# Patient Record
Sex: Male | Born: 1975 | Race: White | Hispanic: No | Marital: Married | Smoking: Former smoker
Health system: Southern US, Community
[De-identification: ages and names within clinical notes are randomized; demographics above are authoritative.]

## PROBLEM LIST (undated history)

## (undated) DIAGNOSIS — I509 Heart failure, unspecified: Secondary | ICD-10-CM

## (undated) DIAGNOSIS — I639 Cerebral infarction, unspecified: Secondary | ICD-10-CM

## (undated) DIAGNOSIS — N210 Calculus in bladder: Secondary | ICD-10-CM

## (undated) DIAGNOSIS — I330 Acute and subacute infective endocarditis: Secondary | ICD-10-CM

## (undated) DIAGNOSIS — N419 Inflammatory disease of prostate, unspecified: Secondary | ICD-10-CM

## (undated) DIAGNOSIS — K648 Other hemorrhoids: Secondary | ICD-10-CM

## (undated) DIAGNOSIS — G459 Transient cerebral ischemic attack, unspecified: Secondary | ICD-10-CM

## (undated) DIAGNOSIS — L719 Rosacea, unspecified: Secondary | ICD-10-CM

## (undated) DIAGNOSIS — I7789 Other specified disorders of arteries and arterioles: Secondary | ICD-10-CM

## (undated) DIAGNOSIS — F419 Anxiety disorder, unspecified: Secondary | ICD-10-CM

## (undated) DIAGNOSIS — R413 Other amnesia: Secondary | ICD-10-CM

## (undated) DIAGNOSIS — E785 Hyperlipidemia, unspecified: Secondary | ICD-10-CM

## (undated) DIAGNOSIS — I6990 Unspecified sequelae of unspecified cerebrovascular disease: Secondary | ICD-10-CM

## (undated) DIAGNOSIS — Z952 Presence of prosthetic heart valve: Secondary | ICD-10-CM

## (undated) DIAGNOSIS — G3184 Mild cognitive impairment, so stated: Secondary | ICD-10-CM

## (undated) HISTORY — DX: Hyperlipidemia, unspecified: E78.5

## (undated) HISTORY — DX: Acute and subacute infective endocarditis: I33.0

## (undated) HISTORY — DX: Calculus in bladder: N21.0

## (undated) HISTORY — PX: WISDOM TOOTH EXTRACTION: SHX21

## (undated) HISTORY — DX: Other amnesia: R41.3

## (undated) HISTORY — DX: Transient cerebral ischemic attack, unspecified: G45.9

## (undated) HISTORY — DX: Other hemorrhoids: K64.8

## (undated) HISTORY — DX: Cerebral infarction, unspecified: I63.9

## (undated) HISTORY — DX: Inflammatory disease of prostate, unspecified: N41.9

## (undated) HISTORY — DX: Presence of prosthetic heart valve: Z95.2

## (undated) HISTORY — DX: Mild cognitive impairment of uncertain or unknown etiology: G31.84

## (undated) HISTORY — DX: Unspecified sequelae of unspecified cerebrovascular disease: I69.90

## (undated) HISTORY — DX: Anxiety disorder, unspecified: F41.9

## (undated) HISTORY — DX: Rosacea, unspecified: L71.9

## (undated) HISTORY — DX: Other specified disorders of arteries and arterioles: I77.89

---

## 1995-01-08 HISTORY — PX: AORTIC VALVE REPLACEMENT: SHX41

## 2001-02-09 ENCOUNTER — Encounter: Payer: Self-pay | Admitting: Cardiology

## 2001-02-09 ENCOUNTER — Ambulatory Visit (HOSPITAL_COMMUNITY): Admission: RE | Admit: 2001-02-09 | Discharge: 2001-02-09 | Payer: Self-pay | Admitting: Cardiology

## 2001-02-10 ENCOUNTER — Ambulatory Visit (HOSPITAL_COMMUNITY): Admission: RE | Admit: 2001-02-10 | Discharge: 2001-02-10 | Payer: Self-pay | Admitting: Cardiology

## 2001-11-13 ENCOUNTER — Encounter: Payer: Self-pay | Admitting: Internal Medicine

## 2001-11-13 ENCOUNTER — Encounter: Admission: RE | Admit: 2001-11-13 | Discharge: 2001-11-13 | Payer: Self-pay | Admitting: Internal Medicine

## 2001-11-27 ENCOUNTER — Encounter: Admission: RE | Admit: 2001-11-27 | Discharge: 2001-11-27 | Payer: Self-pay | Admitting: Internal Medicine

## 2001-11-27 ENCOUNTER — Encounter: Payer: Self-pay | Admitting: Internal Medicine

## 2004-06-29 ENCOUNTER — Ambulatory Visit: Payer: Self-pay | Admitting: Internal Medicine

## 2004-07-02 ENCOUNTER — Ambulatory Visit: Payer: Self-pay | Admitting: Internal Medicine

## 2006-01-07 HISTORY — PX: AORTIC VALVE REPLACEMENT: SHX41

## 2006-10-14 ENCOUNTER — Encounter: Payer: Self-pay | Admitting: Internal Medicine

## 2006-11-25 ENCOUNTER — Encounter: Payer: Self-pay | Admitting: Internal Medicine

## 2006-12-03 ENCOUNTER — Encounter: Payer: Self-pay | Admitting: Internal Medicine

## 2006-12-19 ENCOUNTER — Encounter: Payer: Self-pay | Admitting: Internal Medicine

## 2007-01-08 HISTORY — PX: AORTIC VALVE REPLACEMENT: SHX41

## 2007-02-16 ENCOUNTER — Encounter: Payer: Self-pay | Admitting: Internal Medicine

## 2007-02-23 ENCOUNTER — Encounter: Payer: Self-pay | Admitting: Internal Medicine

## 2007-03-02 ENCOUNTER — Encounter: Payer: Self-pay | Admitting: Internal Medicine

## 2007-04-01 ENCOUNTER — Encounter: Payer: Self-pay | Admitting: Internal Medicine

## 2007-09-02 ENCOUNTER — Encounter: Payer: Self-pay | Admitting: Internal Medicine

## 2008-01-08 DIAGNOSIS — N419 Inflammatory disease of prostate, unspecified: Secondary | ICD-10-CM

## 2008-01-08 HISTORY — PX: OTHER SURGICAL HISTORY: SHX169

## 2008-01-08 HISTORY — DX: Inflammatory disease of prostate, unspecified: N41.9

## 2008-03-09 ENCOUNTER — Encounter: Payer: Self-pay | Admitting: Internal Medicine

## 2008-09-30 ENCOUNTER — Ambulatory Visit: Payer: Self-pay | Admitting: Internal Medicine

## 2008-09-30 DIAGNOSIS — I359 Nonrheumatic aortic valve disorder, unspecified: Secondary | ICD-10-CM | POA: Insufficient documentation

## 2008-09-30 DIAGNOSIS — M79609 Pain in unspecified limb: Secondary | ICD-10-CM | POA: Insufficient documentation

## 2009-03-08 ENCOUNTER — Encounter: Payer: Self-pay | Admitting: Internal Medicine

## 2010-02-08 NOTE — Letter (Signed)
Summary: [facility]  [facility]   Imported By: Lanelle Bal 03/16/2008 12:55:57  _____________________________________________________________________  External Attachment:    Type:   Image     Comment:   External Document

## 2010-02-08 NOTE — Letter (Signed)
Summary: Discharge Summary  Discharge Summary   Imported By: Freddy Jaksch 03/16/2007 10:25:46  _____________________________________________________________________  External Attachment:    Type:   Image     Comment:   External Document

## 2010-02-08 NOTE — Letter (Signed)
Summary: [facility]--discharge letter  [facility]--discharge [facility]   Imported By: Freddy Jaksch 01/06/2007 12:39:51  _____________________________________________________________________  External Attachment:    Type:   Image     Comment:   External Document

## 2010-02-08 NOTE — Letter (Signed)
Summary: [facility]   Imported By: Freddy Jaksch 03/12/2007 09:47:11  _____________________________________________________________________  External Attachment:    Type:   Image     Comment:   External Document

## 2010-02-08 NOTE — Letter (Signed)
Summary: [facility]  [facility]   Imported By: Lanelle Bal 03/24/2009 12:31:26  _____________________________________________________________________  External Attachment:    Type:   Image     Comment:   External Document

## 2010-02-08 NOTE — Letter (Signed)
Summary: [facility]--DISCHARGE SUMMARY  [facility]   Imported By: Freddy Jaksch 03/16/2007 11:10:16  _____________________________________________________________________  External Attachment:    Type:   Image     Comment:   External Document

## 2010-02-08 NOTE — Assessment & Plan Note (Signed)
Summary: ACUTE FOR A BLOOD CLOT IN HISTHIGH//PH   Vital Signs:  Patient profile:   35 year old male Weight:      204 pounds Temp:     97.8 degrees F oral Pulse rate:   76 / minute Resp:     12 per minute BP sitting:   110 / 90  (left arm)  Vitals Entered By: Jeremy Johann CMA (September 30, 2008 12:16 PM) CC: tenderness on inner rt thigh x 1 week   CC:  tenderness on inner rt thigh x 1 week.  History of Present Illness: Onset as tenderness to touch R medial inferior thigh, "as if bruised" 1 week ago. JX:BJYNWGN his hour/ day of jog/walk. No symptoms unless area palpated; no C-P symptoms.  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 CV:  Denies chest pain or discomfort, leg cramps with exertion, palpitations, shortness of breath with exertion, swelling of feet, and swelling of hands.  Physical Exam  General:  well-nourished,in no acute distress; alert,appropriate and cooperative throughout examination Lungs:  Normal respiratory effort, chest expands symmetrically. Lungs are clear to auscultation, no crackles or wheezes. Heart:  Normal rate and regular rhythm. S1  normal without gallop, murmur, click, rub . Soft S4, accentuated S2 Pulses:  R dorsalis pedis and posterior tibial pulses are full and equal bilaterally Extremities:  No clubbing, cyanosis, edema, or deformity noted.Neg Homan's . Slight tenderness R inferior thigh with knee flexed,significantly less so with RLE extended Skin:  ? faint bruising R inf -medial thigh   Impression & Recommendations:  Problem # 1:  THIGH PAIN (ICD-729.5) tendinous injury from jogging  suggested   Problem # 2:  AORTIC VALVE REPLACEMENT, HX OF (ICD-V43.3) X2  Complete Medication List: 1)  Oracea 40 Mg Cpdr  (Doxycycline (rosacea)) .... Take 1 tab once daily 2)  Aspirin 81 Mg Tbec (Aspirin) .... Take 1 tab once daily  Patient Instructions: 1)  Stretching exercises (Ex Cybex) > 3X/week followed by Zostrix cream topically. Glucosamine sulfate 1500 mg once daily until soreness gone.

## 2010-02-08 NOTE — Letter (Signed)
Summary: [facility]//OFFICE VISIT  [facility]//OFFICE VISIT   Imported By: Freddy Jaksch 01/02/2007 10:53:42  _____________________________________________________________________  External Attachment:    Type:   Image     Comment:   External Document

## 2010-02-08 NOTE — Letter (Signed)
Summary: [facility]//CHEMISTRY TRESULT  [facility]Y TRESULT   Imported By: Freddy Jaksch 12/08/2006 10:50:29  _____________________________________________________________________  External Attachment:    Type:   Image     Comment:   External Document

## 2010-02-08 NOTE — Letter (Signed)
Summary: [facility]  [facility]   Imported By: Lanelle Bal 12/29/2007 10:47:14  _____________________________________________________________________  External Attachment:    Type:   Image     Comment:   External Document

## 2010-02-08 NOTE — Letter (Signed)
Summary: [facility]--discharge letter  [facility]--discharge letter   Imported By: Freddy Jaksch 01/13/2007 11:23:23  _____________________________________________________________________  External Attachment:    Type:   Image     Comment:   External Document

## 2010-02-08 NOTE — Letter (Signed)
Summary: [facility]  [facility]   Imported By: Freddy Jaksch 02/25/2007 16:04:07  _____________________________________________________________________  External Attachment:    Type:   Image     Comment:   External Document

## 2010-02-08 NOTE — Letter (Signed)
Summary: Discharge Summary  Discharge Summary   Imported By: Freddy Jaksch 03/04/2007 15:23:59  _____________________________________________________________________  External Attachment:    Type:   Image     Comment:   External Document

## 2010-02-08 NOTE — Letter (Signed)
Summary: [facility]   Imported By: Freddy Jaksch 04/22/2007 10:46:43  _____________________________________________________________________  External Attachment:    Type:   Image     Comment:   External Document

## 2011-12-09 ENCOUNTER — Ambulatory Visit (INDEPENDENT_AMBULATORY_CARE_PROVIDER_SITE_OTHER): Payer: PRIVATE HEALTH INSURANCE | Admitting: Internal Medicine

## 2011-12-09 ENCOUNTER — Encounter: Payer: Self-pay | Admitting: Internal Medicine

## 2011-12-09 VITALS — BP 106/70 | HR 75 | Resp 12 | Ht 75.5 in | Wt 204.0 lb

## 2011-12-09 DIAGNOSIS — N429 Disorder of prostate, unspecified: Secondary | ICD-10-CM

## 2011-12-09 DIAGNOSIS — Z0000 Encounter for general adult medical examination without abnormal findings: Secondary | ICD-10-CM

## 2011-12-09 DIAGNOSIS — E785 Hyperlipidemia, unspecified: Secondary | ICD-10-CM

## 2011-12-09 DIAGNOSIS — Z954 Presence of other heart-valve replacement: Secondary | ICD-10-CM

## 2011-12-09 LAB — CBC WITH DIFFERENTIAL/PLATELET
Basophils Absolute: 0 10*3/uL (ref 0.0–0.1)
Basophils Relative: 0.6 % (ref 0.0–3.0)
Eosinophils Absolute: 0.1 10*3/uL (ref 0.0–0.7)
Eosinophils Relative: 2.3 % (ref 0.0–5.0)
HCT: 45.5 % (ref 39.0–52.0)
Hemoglobin: 15.3 g/dL (ref 13.0–17.0)
Lymphocytes Relative: 32.1 % (ref 12.0–46.0)
Lymphs Abs: 1.5 10*3/uL (ref 0.7–4.0)
MCHC: 33.7 g/dL (ref 30.0–36.0)
MCV: 92.3 fl (ref 78.0–100.0)
Monocytes Absolute: 0.4 10*3/uL (ref 0.1–1.0)
Monocytes Relative: 9.6 % (ref 3.0–12.0)
Neutro Abs: 2.6 10*3/uL (ref 1.4–7.7)
Neutrophils Relative %: 55.4 % (ref 43.0–77.0)
Platelets: 187 10*3/uL (ref 150.0–400.0)
RBC: 4.92 Mil/uL (ref 4.22–5.81)
RDW: 12.8 % (ref 11.5–14.6)
WBC: 4.6 10*3/uL (ref 4.5–10.5)

## 2011-12-09 LAB — LIPID PANEL
Cholesterol: 244 mg/dL — ABNORMAL HIGH (ref 0–200)
HDL: 41 mg/dL (ref >39.00)
Total CHOL/HDL Ratio: 6
Triglycerides: 73 mg/dL (ref 0.0–149.0)
VLDL: 14.6 mg/dL (ref 0.0–40.0)

## 2011-12-09 LAB — HEPATIC FUNCTION PANEL
ALT: 23 U/L (ref 0–53)
AST: 21 U/L (ref 0–37)
Albumin: 4.8 g/dL (ref 3.5–5.2)
Alkaline Phosphatase: 47 U/L (ref 39–117)
Bilirubin, Direct: 0.1 mg/dL (ref 0.0–0.3)
Total Bilirubin: 0.9 mg/dL (ref 0.3–1.2)
Total Protein: 7.2 g/dL (ref 6.0–8.3)

## 2011-12-09 LAB — PSA: PSA: 0.46 ng/mL (ref 0.10–4.00)

## 2011-12-09 LAB — BASIC METABOLIC PANEL
BUN: 17 mg/dL (ref 6–23)
CO2: 27 mEq/L (ref 19–32)
Calcium: 9.2 mg/dL (ref 8.4–10.5)
Chloride: 104 mEq/L (ref 96–112)
Creatinine, Ser: 0.9 mg/dL (ref 0.4–1.5)
GFR: 96.57 mL/min (ref >60.00)
Glucose, Bld: 85 mg/dL (ref 70–99)
Potassium: 4.5 mEq/L (ref 3.5–5.1)
Sodium: 138 mEq/L (ref 135–145)

## 2011-12-09 LAB — TSH: TSH: 0.66 u[IU]/mL (ref 0.35–5.50)

## 2011-12-09 LAB — LDL CHOLESTEROL, DIRECT: Direct LDL: 194.2 mg/dL

## 2011-12-09 NOTE — Patient Instructions (Addendum)
If you activate My Chart; the results can be released to you as soon as they populate from the lab. If you choose not to use this program; the labs have to be reviewed, copied & mailed   causing a delay in getting the results to you. 

## 2011-12-09 NOTE — Progress Notes (Signed)
  Subjective:    Patient ID: Corey Rose, male    DOB: 20-Sep-1975, 36 y.o.   MRN: [id number]  HPI  Corey Rose is here for a physical; he has no acute issues.      Review of Systems He is on a modified heart healthy diet. He exercises for 45 minutes as cardio at least 3 times per week without symptoms. Specifically he denies chest pain, palpitations, dyspnea, or claudication.  Several years ago he was on Zocor for dyslipidemia; this was discontinued due to to muscle pain. Subsequently the lipids have been controlled with lifestyle change.     Objective:   Physical Exam Gen.: Healthy and well-nourished in appearance. Alert, appropriate and cooperative throughout exam. Head: Normocephalic without obvious abnormalities Eyes: No corneal or conjunctival inflammation noted. Pupils equal round reactive to light and accommodation. Fundal exam is benign without hemorrhages, exudate, papilledema. Extraocular motion intact. Vision grossly normal. Ears: External  ear exam reveals no significant lesions or deformities. Canals clear .TMs normal. Hearing is grossly normal bilaterally. Nose: External nasal exam reveals no deformity or inflammation. Nasal mucosa are pink and moist. No lesions or exudates noted.  Mouth: Oral mucosa and oropharynx reveal no lesions or exudates. Teeth in good repair. Neck: No deformities, masses, or tenderness noted. Range of motion & Thyroid normal. Lungs: Normal respiratory effort; chest expands symmetrically. Lungs are clear to auscultation without rales, wheezes, or increased work of breathing. Heart: Normal rate and rhythm. Normal S1 ; accentuated  S2 LSB. No gallop, click, or rub. No murmur. Abdomen: Bowel sounds normal; abdomen soft and nontender. No masses, organomegaly or hernias noted. Genitalia / WUJ:WJXBJYNWG normal except for left varices & granuloma. Left lobe of prostate normal; right lobe upper limits of normal to minimally enlarged w/o nodularity, or  induration. Musculoskeletal/extremities: No deformity or scoliosis noted of  the thoracic or lumbar spine. No clubbing, cyanosis, edema, or deformity noted. Range of motion  normal .Tone & strength  normal.Joints normal. Nail health  good. Vascular: Carotid, radial artery, dorsalis pedis and  posterior tibial pulses are full and equal. No bruits present. Neurologic: Alert and oriented x3. Deep tendon reflexes symmetrical and normal.          Skin: Intact without suspicious lesions or rashes. Lymph: No cervical, axillary, or inguinal lymphadenopathy present. Psych: Mood and affect are normal. Normally interactive                                                                                       Assessment & Plan:  #1 comprehensive physical exam  #2 prostatic asymmetry in the context of past history of urine flow issues and prostatitis  Plan: see Orders

## 2012-01-07 ENCOUNTER — Ambulatory Visit: Payer: PRIVATE HEALTH INSURANCE | Admitting: Cardiology

## 2012-01-20 ENCOUNTER — Ambulatory Visit (INDEPENDENT_AMBULATORY_CARE_PROVIDER_SITE_OTHER): Payer: PRIVATE HEALTH INSURANCE | Admitting: Cardiology

## 2012-01-20 ENCOUNTER — Encounter: Payer: Self-pay | Admitting: Cardiology

## 2012-01-20 VITALS — BP 112/66 | HR 74 | Ht 75.5 in | Wt 204.0 lb

## 2012-01-20 DIAGNOSIS — Z954 Presence of other heart-valve replacement: Secondary | ICD-10-CM

## 2012-01-20 DIAGNOSIS — I359 Nonrheumatic aortic valve disorder, unspecified: Secondary | ICD-10-CM

## 2012-01-20 DIAGNOSIS — E785 Hyperlipidemia, unspecified: Secondary | ICD-10-CM

## 2012-01-20 NOTE — Progress Notes (Signed)
Patient ID: Corey Rose, male   DOB: Apr 24, 1975, 37 y.o.   MRN: [id number] PCP: Dr. Alwyn Ren  37 yo with history of bicuspid valve disorder s/p AVR x 2 presents to establish cardiology followup.  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  He had a homograft aortic valve placed at that time.  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  He had repeat AVR with a Freestyle Medtronic bioprosthetic valve in 2009. Both AVRs were at [facility] (Dr. Nevada Crane).  He was living in [place] until last summer when he moved back to [place].    Patient has been stable in general.  He does a combination of walking/jogging for about 50 minutes several times a week.  No exertional dyspnea.  He is winded climbing steps if he is carrying a bag or trying to walk fast.  This is chronic.  No chest pain.  No tachypalpitations or syncope.  His LDL was very high when checked recently.  He does not have a strong family history of CAD.   ECG: NSR, nonspecific inferior and anterolateral T wave flattening.   Labs (12/13): K 4.5, creatinine 0.9, HDL 41, LDL 194, TSH normal  PMH: 1. Hyperlipidemia: Myalgias with simvastatin. 2. 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 AVRs were both at [facility] Dr. Nevada Crane.  3. H/o prostatitis 4. H/o bladder stones  SH: Married, 1 child, lives in [place].  Works in [employer], does a lot of traveling.   FH: Father with atrial fibrillation, grandfather with "heart disease."   ROS: All systems reviewed and negative except as per HPI.   Current Outpatient Prescriptions  Medication Sig Dispense Refill  . aspirin 81 MG tablet Take 81 mg by mouth daily.      . Tamsulosin HCl (FLOMAX) 0.4 MG CAPS Take 0.4 mg by mouth  daily.       BP 112/66  Pulse 74  Ht 6' 3.5" (1.918 m)  Wt 204 lb (92.534 kg)  BMI 25.16 kg/m2 General: NAD Neck: No JVD, no thyromegaly or thyroid nodule.  Lungs: Clear to auscultation bilaterally with normal respiratory effort. CV: Nondisplaced PMI.  Heart regular S1/S2, no S3/S4, soft 1/6 SEM RUSB.  No peripheral edema.  No carotid bruit.  Normal pedal pulses.  Abdomen: Soft, nontender, no hepatosplenomegaly, no distention.  Skin: Intact without lesions or rashes.  Neurologic: Alert and oriented x 3.  Psych: Normal affect. Extremities: No clubbing or cyanosis.  HEENT: Normal.   Assessment/Plan: 1. Bicuspid aortic valve disorder: Patient had bicuspid aortic valve and developed endocarditis.  He had AVR initially in 1997 then developed a pseudoaneurysm of the aortic root and repeat AVR with a bioprosthetic valve in 2009.  Since that time, he has been doing well.  - I will get an echocardiogram for baseline evaluation of bioprosthetic valve.  - MR angiogram chest to assess for thoracic aortic aneurysm given history of bicuspid aortic valve.  - Needs antibiotic prophylaxis with dental procedures.  2. Hyperlipidemia: LDL is very high.  It would actually be reasonable to go ahead and initiate a statin.  He, however, does not have a strong family history of CAD.  He had myalgias with simvastatin.  He wants to try 6 months of dietary  changes.  I will therefore get a Lipomed profile in 6 months.  If LDL is still high or if he has a high risk profile, will start him on Crestor 5 mg every other day.    Followup in 1 year.   Marca Ancona 01/20/2012 1:52 PM

## 2012-01-20 NOTE — Patient Instructions (Addendum)
Your physician has requested that you have an echocardiogram. Echocardiography is a painless test that uses sound waves to create images of your heart. It provides your doctor with information about the size and shape of your heart and how well your heart's chambers and valves are working. This procedure takes approximately one hour. There are no restrictions for this procedure.  Schedule an appointment for an MRA of your chest with and without contrast.    Your physician recommends that you return for a FASTING lipomed profile in 6 months. (July 2014).   Your physician wants you to follow-up in: 1 year with Dr Shirlee Latch. (January 2015).  You will receive a reminder letter in the mail two months in advance. If you don't receive a letter, please call our office to schedule the follow-up appointment.

## 2012-01-27 ENCOUNTER — Ambulatory Visit (HOSPITAL_COMMUNITY): Payer: PRIVATE HEALTH INSURANCE

## 2012-02-03 ENCOUNTER — Ambulatory Visit (HOSPITAL_COMMUNITY): Payer: PRIVATE HEALTH INSURANCE | Attending: Cardiovascular Disease | Admitting: Radiology

## 2012-02-03 DIAGNOSIS — I359 Nonrheumatic aortic valve disorder, unspecified: Secondary | ICD-10-CM

## 2012-02-03 DIAGNOSIS — I369 Nonrheumatic tricuspid valve disorder, unspecified: Secondary | ICD-10-CM | POA: Insufficient documentation

## 2012-02-03 DIAGNOSIS — Z954 Presence of other heart-valve replacement: Secondary | ICD-10-CM | POA: Insufficient documentation

## 2012-02-03 NOTE — Progress Notes (Signed)
Echocardiogram performed.  

## 2012-02-10 ENCOUNTER — Ambulatory Visit (HOSPITAL_COMMUNITY)
Admission: RE | Admit: 2012-02-10 | Discharge: 2012-02-10 | Disposition: A | Discharge status: Home / Self Care | Payer: PRIVATE HEALTH INSURANCE | Source: Ambulatory Visit | Attending: Cardiology | Admitting: Cardiology

## 2012-02-10 DIAGNOSIS — I359 Nonrheumatic aortic valve disorder, unspecified: Secondary | ICD-10-CM

## 2012-02-10 DIAGNOSIS — Z952 Presence of prosthetic heart valve: Secondary | ICD-10-CM | POA: Insufficient documentation

## 2012-02-10 DIAGNOSIS — I719 Aortic aneurysm of unspecified site, without rupture: Secondary | ICD-10-CM

## 2012-02-10 MED ORDER — GADOBENATE DIMEGLUMINE 529 MG/ML IV SOLN
20.0000 mL | Freq: Once | INTRAVENOUS | Status: AC
  Administered 2012-02-10: 20 mL via INTRAVENOUS

## 2012-06-05 ENCOUNTER — Telehealth: Payer: Self-pay | Admitting: Cardiology

## 2012-06-05 DIAGNOSIS — I330 Acute and subacute infective endocarditis: Secondary | ICD-10-CM

## 2012-06-05 DIAGNOSIS — I359 Nonrheumatic aortic valve disorder, unspecified: Secondary | ICD-10-CM

## 2012-06-05 NOTE — Telephone Encounter (Signed)
Spoke with patient. Pt has a history of endocarditis. He has an infectious disease doctor in [place] that is retiring. He would like to know it Dr Shirlee Latch thinks he should be referred to infectious disease doctor. He states in the past he has called his infectious disease doctor in [facility] when he has night sweats and has gone to get blood cultures. He states he has night sweats x 2 in the last 2 weeks. No other symptoms including fever, no recent dental appts. I will review with Dr Shirlee Latch.

## 2012-06-05 NOTE — Telephone Encounter (Signed)
Reviewed with Dr Shirlee Latch. He recommended referral to Dr Cliffton Asters or Dr Johny Sax in [place]. LMTCB for pt.

## 2012-06-05 NOTE — Telephone Encounter (Signed)
New problem    Need to speak to you regarding an infectious disease doctor

## 2012-06-05 NOTE — Telephone Encounter (Signed)
Spoke with pt. He is aware of Dr Alford Highland recommendation to schedule an appointment with  Dr Cliffton Asters or Dr Johny Sax.

## 2012-06-05 NOTE — Telephone Encounter (Signed)
LMTCB

## 2012-07-13 ENCOUNTER — Other Ambulatory Visit: Payer: PRIVATE HEALTH INSURANCE

## 2012-08-10 ENCOUNTER — Other Ambulatory Visit: Payer: Self-pay | Admitting: Cardiology

## 2012-08-10 ENCOUNTER — Other Ambulatory Visit: Payer: PRIVATE HEALTH INSURANCE

## 2012-08-10 DIAGNOSIS — I359 Nonrheumatic aortic valve disorder, unspecified: Secondary | ICD-10-CM

## 2012-08-11 LAB — NMR LIPOPROFILE WITH LIPIDS
Cholesterol, Total: 243 mg/dL — ABNORMAL HIGH (ref <200)
HDL Particle Number: 25.5 umol/L — ABNORMAL LOW (ref >=30.5)
HDL Size: 8.6 nm — ABNORMAL LOW (ref >=9.2)
HDL-C: 41 mg/dL (ref >=40)
LDL (calc): 185 mg/dL — ABNORMAL HIGH (ref <100)
LDL Particle Number: 1894 nmol/L — ABNORMAL HIGH (ref <1000)
LDL Size: 21.3 nm (ref >20.5)
LP-IR Score: 41 (ref <=45)
Large HDL-P: 1.8 umol/L — ABNORMAL LOW (ref >=4.8)
Large VLDL-P: 1.6 nmol/L (ref <=2.7)
Small LDL Particle Number: <90 nmol/L (ref <=527)
Triglycerides: 83 mg/dL (ref <150)
VLDL Size: 38.6 nm (ref <=46.6)

## 2012-08-14 MED ORDER — ROSUVASTATIN CALCIUM 5 MG PO TABS: ORAL_TABLET | ORAL | Status: DC

## 2012-08-14 NOTE — Addendum Note (Signed)
Addended by: Levi Aland on: 08/14/2012 05:10 PM   Modules accepted: Orders

## 2012-08-14 NOTE — Progress Notes (Signed)
Called patient to inform him that we do not have any Crestor samples; patient verbalized understanding and Rx sent to patient's pharmacy per his request.

## 2012-11-12 ENCOUNTER — Other Ambulatory Visit: Payer: Self-pay

## 2012-12-10 ENCOUNTER — Encounter: Payer: PRIVATE HEALTH INSURANCE | Admitting: Internal Medicine

## 2013-01-21 ENCOUNTER — Telehealth: Payer: Self-pay

## 2013-01-21 DIAGNOSIS — E785 Hyperlipidemia, unspecified: Secondary | ICD-10-CM

## 2013-01-21 DIAGNOSIS — Z0000 Encounter for general adult medical examination without abnormal findings: Secondary | ICD-10-CM

## 2013-01-21 NOTE — Telephone Encounter (Signed)
Medication List and allergies:  Reviewed and updated  90 day supply/mail order: na Local prescriptions: [facility] and [facility]  Immunizations due: declines flu vaccine (has had flu this season)  A/P:   No changes to FH, PSH or Personal Hx Tdap--< 10 years PSA--12/2011--0.46 Orders entered for CMET, CBC, Hep Panel, Lipid Panel, TSH at [facility]  To Discuss with Provider: Not at this time

## 2013-01-25 ENCOUNTER — Ambulatory Visit (INDEPENDENT_AMBULATORY_CARE_PROVIDER_SITE_OTHER): Payer: PRIVATE HEALTH INSURANCE | Admitting: Internal Medicine

## 2013-01-25 ENCOUNTER — Encounter: Payer: Self-pay | Admitting: Internal Medicine

## 2013-01-25 ENCOUNTER — Other Ambulatory Visit (INDEPENDENT_AMBULATORY_CARE_PROVIDER_SITE_OTHER): Payer: PRIVATE HEALTH INSURANCE

## 2013-01-25 VITALS — BP 107/68 | HR 80 | Temp 98.4°F | Ht 75.25 in | Wt 209.6 lb

## 2013-01-25 DIAGNOSIS — Z0000 Encounter for general adult medical examination without abnormal findings: Secondary | ICD-10-CM

## 2013-01-25 DIAGNOSIS — E785 Hyperlipidemia, unspecified: Secondary | ICD-10-CM

## 2013-01-25 LAB — LIPID PANEL
Cholesterol: 148 mg/dL (ref 0–200)
HDL: 43.3 mg/dL (ref >39.00)
LDL Cholesterol: 97 mg/dL (ref 0–99)
Total CHOL/HDL Ratio: 3
Triglycerides: 38 mg/dL (ref 0.0–149.0)
VLDL: 7.6 mg/dL (ref 0.0–40.0)

## 2013-01-25 LAB — CBC WITH DIFFERENTIAL/PLATELET
Basophils Absolute: 0 10*3/uL (ref 0.0–0.1)
Basophils Relative: 0.4 % (ref 0.0–3.0)
Eosinophils Absolute: 0.1 10*3/uL (ref 0.0–0.7)
Eosinophils Relative: 2 % (ref 0.0–5.0)
HCT: 44.2 % (ref 39.0–52.0)
Hemoglobin: 14.9 g/dL (ref 13.0–17.0)
Lymphocytes Relative: 32.5 % (ref 12.0–46.0)
Lymphs Abs: 1.6 10*3/uL (ref 0.7–4.0)
MCHC: 33.6 g/dL (ref 30.0–36.0)
MCV: 91.2 fl (ref 78.0–100.0)
Monocytes Absolute: 0.5 10*3/uL (ref 0.1–1.0)
Monocytes Relative: 10.9 % (ref 3.0–12.0)
Neutro Abs: 2.6 10*3/uL (ref 1.4–7.7)
Neutrophils Relative %: 54.2 % (ref 43.0–77.0)
Platelets: 192 10*3/uL (ref 150.0–400.0)
RBC: 4.85 Mil/uL (ref 4.22–5.81)
RDW: 12.6 % (ref 11.5–14.6)
WBC: 4.9 10*3/uL (ref 4.5–10.5)

## 2013-01-25 LAB — HEPATIC FUNCTION PANEL
ALT: 21 U/L (ref 0–53)
AST: 21 U/L (ref 0–37)
Albumin: 4.3 g/dL (ref 3.5–5.2)
Alkaline Phosphatase: 48 U/L (ref 39–117)
Bilirubin, Direct: 0.1 mg/dL (ref 0.0–0.3)
Total Bilirubin: 0.9 mg/dL (ref 0.3–1.2)
Total Protein: 7.4 g/dL (ref 6.0–8.3)

## 2013-01-25 LAB — BASIC METABOLIC PANEL
BUN: 13 mg/dL (ref 6–23)
CO2: 27 mEq/L (ref 19–32)
Calcium: 9.1 mg/dL (ref 8.4–10.5)
Chloride: 105 mEq/L (ref 96–112)
Creatinine, Ser: 1 mg/dL (ref 0.4–1.5)
GFR: 85.32 mL/min (ref >60.00)
Glucose, Bld: 83 mg/dL (ref 70–99)
Potassium: 4.4 mEq/L (ref 3.5–5.1)
Sodium: 140 mEq/L (ref 135–145)

## 2013-01-25 LAB — TSH: TSH: 0.95 u[IU]/mL (ref 0.35–5.50)

## 2013-01-25 NOTE — Progress Notes (Signed)
   Subjective:    Patient ID: Corey Rose, male    DOB: [birth year], 38 y.o.   MRN: [id number]  HPI He is here for a physical;acute issues include occasional palpitations. She is caffeine to be a trigger. There was a demonstrable decrease when he decreased the amount of caffeine ingestion.     Review of Systems  A heart healthy diet is followed; exercise encompasses 50-60 minutes 4-5  times per week as walking/running without symptoms.  Family history is negative for premature coronary disease. Advanced cholesterol testing reveals  LDL goal is less than 130 ; ideally < 100 . There is medication compliance with the statin.  Low dose ASA taken Specifically denied are  chest pain,  dyspnea, or claudication.  Significant abdominal symptoms, memory deficit, or myalgias not present.      Objective:   Physical Exam Gen.: Healthy and well-nourished in appearance. Alert, appropriate and cooperative throughout exam. Appears younger than stated age  Head: Normocephalic without obvious abnormalities;no alopecia  Eyes: No corneal or conjunctival inflammation noted. Pupils equal round reactive to light and accommodation. Extraocular motion intact. Ears: External  ear exam reveals no significant lesions or deformities. Canals clear .TMs normal.  Nose: External nasal exam reveals no deformity or inflammation. Nasal mucosa are pink and moist. No lesions or exudates noted.   Mouth: Oral mucosa and oropharynx reveal no lesions or exudates. Teeth in good repair. Neck: No deformities, masses, or tenderness noted. Range of motion &Thyroid normal. Lungs: Normal respiratory effort; chest expands symmetrically. Lungs are clear to auscultation without rales, wheezes, or increased work of breathing. Heart: Normal rate and rhythm. Normal S1 ; accentuated S2. No gallop, click, or rub. No murmur. Abdomen: Bowel sounds normal; abdomen soft and nontender. No masses, organomegaly or hernias noted. Genitalia: as per  Urology                                  Musculoskeletal/extremities: No deformity or scoliosis noted of  the thoracic or lumbar spine.  No clubbing, cyanosis, edema, or significant extremity  deformity noted. Range of motion normal .Tone & strength normal. Hand joints normal . Fingernail  health good. Able to lie down & sit up w/o help. Negative SLR bilaterally Vascular: Carotid, radial artery, dorsalis pedis and  posterior tibial pulses are full and equal. No bruits present. Neurologic: Alert and oriented x3. Deep tendon reflexes symmetrical and normal.        Skin: Intact without suspicious lesions or rashes. Lymph: No cervical, axillary lymphadenopathy present. Psych: Mood and affect are normal. Normally interactive                                                                                        Assessment & Plan:  #1 comprehensive physical exam; no acute findings  Plan: see Orders  & Recommendations

## 2013-01-25 NOTE — Patient Instructions (Signed)
Please review Dr Nunzio Cory book Eat, [place] for best  dietary cholesterol information.  Cardiovascular exercise is recommended 30-45 minutes 3-4 times per week.

## 2013-01-25 NOTE — Progress Notes (Signed)
Pre visit review using our clinic review tool, if applicable. No additional management support is needed unless otherwise documented below in the visit note. 

## 2013-08-16 ENCOUNTER — Ambulatory Visit (INDEPENDENT_AMBULATORY_CARE_PROVIDER_SITE_OTHER): Payer: PRIVATE HEALTH INSURANCE | Admitting: Sports Medicine

## 2013-08-16 ENCOUNTER — Encounter: Payer: Self-pay | Admitting: Sports Medicine

## 2013-08-16 VITALS — BP 110/72 | HR 81 | Ht 75.0 in | Wt 200.0 lb

## 2013-08-16 DIAGNOSIS — IMO0002 Reserved for concepts with insufficient information to code with codable children: Secondary | ICD-10-CM

## 2013-08-16 DIAGNOSIS — S76011A Strain of muscle, fascia and tendon of right hip, initial encounter: Secondary | ICD-10-CM

## 2013-08-16 NOTE — Progress Notes (Signed)
   Subjective:    Patient ID: Corey Rose, male    DOB: [birth year], 38 y.o.   MRN: [id number]  HPI Mr. Hulgan is a new patient who presents to clinic with right hip pain. He first noticed this pain about 4 weeks ago when he woke up and started putting his clothes on. He felt a sharp, pulling sensation at his right hip that bothered him most when walking/running. He attributes the pain to an increased frequency of running on pavement the week prior to the start of the pain. He typically does a run/walk regimen of 4-5 miles on gravel or grass. He eased his normal workout regimen, and has been completely pain free for the past 1 week. He has resumed all normal activities, and has begun stretching at the end of his workouts for preventive measure.   Review of Systems     Objective:   Physical Exam Gen: well-dressed, well-nourished man sitting comfortably MSK: Right hip. Full range of motion. No TTP over hip flexors. Positive Thomas test for right hip flexors.        Assessment & Plan:  **Right hip pain: likely muscle strain, now resolved - counseled patient on warming up prior to activity and ending workouts with stretching - reviewed handout on hip flexor stretching exercises - advised patient to return if pain recurs and cannot be resolved with NSAIDs  Written by: Ivan Anchors, MS4

## 2013-12-06 ENCOUNTER — Ambulatory Visit (INDEPENDENT_AMBULATORY_CARE_PROVIDER_SITE_OTHER): Payer: PRIVATE HEALTH INSURANCE | Admitting: *Deleted

## 2013-12-06 ENCOUNTER — Ambulatory Visit (INDEPENDENT_AMBULATORY_CARE_PROVIDER_SITE_OTHER): Payer: PRIVATE HEALTH INSURANCE | Admitting: Internal Medicine

## 2013-12-06 ENCOUNTER — Encounter: Payer: Self-pay | Admitting: Internal Medicine

## 2013-12-06 VITALS — BP 115/73 | HR 74 | Temp 98.1°F | Ht 75.0 in | Wt 218.0 lb

## 2013-12-06 DIAGNOSIS — Z8679 Personal history of other diseases of the circulatory system: Secondary | ICD-10-CM

## 2013-12-06 DIAGNOSIS — Z23 Encounter for immunization: Secondary | ICD-10-CM

## 2013-12-06 NOTE — Progress Notes (Signed)
Patient ID: Corey Rose, male   DOB: 1975/10/02, 38 y.o.   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 Mar Daring  Patient Active Problem List   Diagnosis Date Noted  . Hyperlipidemia 12/09/2011  . AORTIC VALVE REPLACEMENT, HX OF 09/30/2008    Patient's Medications  New Prescriptions   No medications on file  Previous Medications   ASPIRIN 81 MG TABLET    Take 81 mg by mouth daily.   ROSUVASTATIN (CRESTOR) 5 MG TABLET    Take 1 tab (5 mg) every other day or 2.5 mg (1/2 tab) every day.   TAMSULOSIN HCL (FLOMAX) 0.4 MG CAPS    Take 0.4 mg by mouth daily.  Modified Medications   No medications on file  Discontinued Medications   No medications on file    Recommendations: 1. No diagnostic or treatment interventions needed at this time 2. Continue amoxicillin prophylaxis before dental treatments 3. Follow-up here as needed   Assessment: Mr. 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 He is currently without any complications. He knows to take amoxicillin before dental procedures. He knows he can call me if he has any concerns about recurrent endocarditis in the future.   HPI: Corey Rose is a 38 y.o. male with a history of bicuspid aortic valve who developed high fever and right ankle swelling in 1997 at age 16. 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 He underwent emergent homograft aortic valve replacement by Dr. Clementeen Graham at [facility]. 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 A Freestyle Medtronic bioprosthetic aortic valve replacement was done in 2009 by Dr. Clementeen Graham. He states he has occasional palpitations but otherwise he's had no cardiac complications since  that time. He underwent a cardiac MRI in February 2014 which showed the valve and aortic root to be normal in appearance. He routinely takes amoxicillin 2 g one hour before dental procedures. He recalls having to be hospitalized with a bout of prostatitis while living in [place], [place] and 2010. He was treated with ciprofloxacin and recovered uneventfully.   He states that he worries whenever he develops any illness with fever. He used to see Dr. Marily Lente at [facility] every 1-2 years. Dr. Leontine Locket is now retired and he states that he would feel more comfortable if he had an established relationship with an infectious disease doctor hearing [place].  Review of Systems: Constitutional: negative Eyes: negative Ears, nose, mouth, throat, and face: negative Respiratory: negative Cardiovascular: positive for palpitations Gastrointestinal: negative Genitourinary:negative    Past Medical History  Diagnosis Date  . History of aortic valve replacement 1997, 2009   . Prostatitis 2010     Dr Merryl Hacker , [facility]  . Rosacea   . Hyperlipidemia     History  Substance Use Topics  . Smoking status: Former Smoker    Start date: 01/07/1994    Quit date: 01/07/1994  . Smokeless tobacco: Never Used     Comment: smoked 1990-1996, up to 1 pp week  . Alcohol Use: No     Comment:  quit 2009    Family History  Problem Relation Age of Onset  . Hyperlipidemia Mother   . Skin cancer  Mother     ? melanoma, basal & squamous cell  . Hyperlipidemia Father   . Atrial fibrillation Father   . Skin cancer Father   . Stroke Neg Hx   . Diabetes Neg Hx   . Heart attack Neg Hx    Allergies  Allergen Reactions  . Zocor [Simvastatin]     Muscle pain    OBJECTIVE: Blood pressure 115/73, pulse 74, temperature 98.1 F (36.7 C), temperature source Oral, height 6\' 3"  (1.905 m), weight 218 lb (98.884 kg). General: He is a healthy-appearing young man. He is in  good spirits Skin: No rash Lungs: Clear Cor: Regular S1 and S2 with a 1/6 early systolic murmur Abdomen: Soft and nontender Joints and extremities: Normal  Microbiology: No results found for this or any previous visit (from the past 240 hour(s)).  Michel Bickers, MD [facility] for Infectious [facility] [phone] pager   [phone] cell 12/06/2013, 2:22 PM

## 2014-02-17 ENCOUNTER — Other Ambulatory Visit: Payer: Self-pay | Admitting: Cardiology

## 2014-02-21 ENCOUNTER — Telehealth: Payer: Self-pay

## 2014-02-21 ENCOUNTER — Other Ambulatory Visit: Payer: Self-pay | Admitting: Nurse Practitioner

## 2014-02-21 DIAGNOSIS — I359 Nonrheumatic aortic valve disorder, unspecified: Secondary | ICD-10-CM

## 2014-02-21 DIAGNOSIS — E785 Hyperlipidemia, unspecified: Secondary | ICD-10-CM

## 2014-02-21 NOTE — Telephone Encounter (Signed)
May refill Crestor but needs lipids/LFTs checked.

## 2014-02-22 ENCOUNTER — Other Ambulatory Visit: Payer: Self-pay

## 2014-02-22 MED ORDER — ASPIRIN 81 MG PO TABS: 81.0000 mg | ORAL_TABLET | Freq: Every day | ORAL | Status: DC

## 2014-02-24 NOTE — Telephone Encounter (Signed)
I have placed an order for fasting lipid /liver profile in Epic.  I have sent a message to Dr Claris Gladden scheduler to schedule fasting lab and an appt for pt with Dr Aundra Dubin.

## 2014-03-01 NOTE — Telephone Encounter (Signed)
2.23.16 lvm for pt to call back to sched these appt.dwm

## 2014-03-11 ENCOUNTER — Other Ambulatory Visit: Payer: PRIVATE HEALTH INSURANCE | Admitting: *Deleted

## 2014-03-21 ENCOUNTER — Other Ambulatory Visit (INDEPENDENT_AMBULATORY_CARE_PROVIDER_SITE_OTHER): Payer: PRIVATE HEALTH INSURANCE | Admitting: *Deleted

## 2014-03-21 DIAGNOSIS — I359 Nonrheumatic aortic valve disorder, unspecified: Secondary | ICD-10-CM

## 2014-03-21 DIAGNOSIS — E785 Hyperlipidemia, unspecified: Secondary | ICD-10-CM

## 2014-03-21 LAB — HEPATIC FUNCTION PANEL
ALT: 18 U/L (ref 0–53)
AST: 20 U/L (ref 0–37)
Albumin: 4.2 g/dL (ref 3.5–5.2)
Alkaline Phosphatase: 57 U/L (ref 39–117)
Bilirubin, Direct: 0.1 mg/dL (ref 0.0–0.3)
Total Bilirubin: 0.5 mg/dL (ref 0.2–1.2)
Total Protein: 6.6 g/dL (ref 6.0–8.3)

## 2014-03-21 LAB — LIPID PANEL
Cholesterol: 169 mg/dL (ref 0–200)
HDL: 44.2 mg/dL (ref >39.00)
LDL Cholesterol: 111 mg/dL — ABNORMAL HIGH (ref 0–99)
NonHDL: 124.8
Total CHOL/HDL Ratio: 4
Triglycerides: 67 mg/dL (ref 0.0–149.0)
VLDL: 13.4 mg/dL (ref 0.0–40.0)

## 2014-03-21 NOTE — Addendum Note (Signed)
Addended by: Eulis Foster on: 03/21/2014 08:13 AM   Modules accepted: Orders

## 2014-03-27 ENCOUNTER — Other Ambulatory Visit: Payer: Self-pay | Admitting: Cardiology

## 2014-04-06 ENCOUNTER — Other Ambulatory Visit: Payer: Self-pay

## 2014-04-26 ENCOUNTER — Other Ambulatory Visit: Payer: Self-pay | Admitting: Cardiology

## 2014-06-03 ENCOUNTER — Ambulatory Visit: Payer: PRIVATE HEALTH INSURANCE | Admitting: Cardiology

## 2014-06-13 ENCOUNTER — Other Ambulatory Visit: Payer: Self-pay | Admitting: *Deleted

## 2014-06-13 ENCOUNTER — Other Ambulatory Visit: Payer: Self-pay | Admitting: Cardiology

## 2014-06-13 MED ORDER — ROSUVASTATIN CALCIUM 5 MG PO TABS: ORAL_TABLET | ORAL | Status: DC

## 2014-09-16 ENCOUNTER — Ambulatory Visit (INDEPENDENT_AMBULATORY_CARE_PROVIDER_SITE_OTHER): Payer: PRIVATE HEALTH INSURANCE | Admitting: Cardiology

## 2014-09-16 ENCOUNTER — Encounter: Payer: Self-pay | Admitting: Cardiology

## 2014-09-16 VITALS — BP 112/76 | HR 82 | Ht 75.0 in | Wt 214.4 lb

## 2014-09-16 DIAGNOSIS — I359 Nonrheumatic aortic valve disorder, unspecified: Secondary | ICD-10-CM

## 2014-09-16 DIAGNOSIS — I712 Thoracic aortic aneurysm, without rupture: Secondary | ICD-10-CM

## 2014-09-16 DIAGNOSIS — E785 Hyperlipidemia, unspecified: Secondary | ICD-10-CM | POA: Diagnosis not present

## 2014-09-16 DIAGNOSIS — I77810 Thoracic aortic ectasia: Secondary | ICD-10-CM

## 2014-09-16 MED ORDER — ROSUVASTATIN CALCIUM 5 MG PO TABS: ORAL_TABLET | ORAL | Status: DC

## 2014-09-16 NOTE — Patient Instructions (Addendum)
Medication Instructions:  No changes today  Labwork: None today  Testing/Procedures: Schedule an appointment for an MRA of your chest in February in 2017.  Follow-Up: Your physician wants you to follow-up in: 1 year with Dr Aundra Dubin. (September 2017) You will receive a reminder letter in the mail two months in advance. If you don't receive a letter, please call our office to schedule the follow-up appointment.  Marland Kitchen

## 2014-09-18 NOTE — Progress Notes (Signed)
Patient ID: Corey Rose, male   DOB: 07-14-1975, 39 y.o.   MRN: [id number] PCP: Dr. Linna Darner  39 yo with history of bicuspid valve disorder s/p AVR x 2 presents to establish cardiology followup.  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  He had a homograft aortic valve placed at that time.  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  He had repeat AVR with a Freestyle Medtronic bioprosthetic valve in 2009. Both AVRs were at [facility] (Dr. Clementeen Graham).    Patient has been stable in general.  He has been exercising fairly heavily with on problems.  No exertional dyspnea or chest pain.  No lightheadedness.  No tachypalpitations or syncope.  Most recent echo from [facility] was stable. He has occasional palpitations, especially with caffeine use.   ECG: NSR, normal  Labs (12/13): K 4.5, creatinine 0.9, HDL 41, LDL 194, TSH normal Labs (3/16): LDL 111, HDL 44  PMH: 1. Hyperlipidemia: Myalgias with simvastatin. 2. 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 AVRs were both at [facility], Dr. Clementeen Graham.  Echo (1/14) with EF 55-60%, bioprosthetic aortic valve with mean gradient 5 mmHg.  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  Echo (3/16) with EF 55-60%, bioprosthetic aortic valve with mean gradient 4 mmHg and trivial AI, normal RV size and systolic function,  3. H/o prostatitis 4. H/o bladder stones  SH: Married, 1 child, lives in [place].  Works in Scientist, clinical (histocompatibility and immunogenetics), does a lot of traveling.   FH: Father with atrial fibrillation, grandfather with "heart disease."   ROS: All systems reviewed and negative except as per HPI.   Current Outpatient Prescriptions  Medication Sig Dispense Refill  . aspirin 81 MG tablet Take 1 tablet (81 mg  total) by mouth daily. 30 tablet 3  . rosuvastatin (CRESTOR) 5 MG tablet TAKE 1 TABLET BY MOUTH EVERY OTHER DAY OR 1/2 TABLET BY MOUTH DAILY AS DIRECTED 30 tablet 12  . Tamsulosin HCl (FLOMAX) 0.4 MG CAPS Take 0.4 mg by mouth daily.     No current facility-administered medications for this visit.   BP 112/76 mmHg  Pulse 82  Ht 6\' 3"  (1.905 m)  Wt 214 lb 6.4 oz (97.251 kg)  BMI 26.80 kg/m2 General: NAD Neck: No JVD, no thyromegaly or thyroid nodule.  Lungs: Clear to auscultation bilaterally with normal respiratory effort. CV: Nondisplaced PMI.  Heart regular S1/S2, no S3/S4, soft 1/6 SEM RUSB.  No peripheral edema.  No carotid bruit.  Normal pedal pulses.  Abdomen: Soft, nontender, no hepatosplenomegaly, no distention.  Skin: Intact without lesions or rashes.  Neurologic: Alert and oriented x 3.  Psych: Normal affect. Extremities: No clubbing or cyanosis.  HEENT: Normal.   Assessment/Plan: 1. Bicuspid aortic valve disorder: Patient had bicuspid aortic valve and developed endocarditis.  He had AVR initially in 1997 then developed a pseudoaneurysm of the aortic root and repeat AVR with a bioprosthetic valve in 2009.  Since that time, he has been doing well.  Last echo in 3/16 showed a well-seated prosthetic valve.  He had mild ascending aorta dilation on MRA chest in 2/14.  - I will arrange for an MRA chest again in 2/17 to follow the ascending aorta size.  - Needs antibiotic prophylaxis  with dental procedures.  2. Hyperlipidemia: LDL better on Crestor.  Continue.    3. Palpitations: Suspect occasional PACs or PVCs.   Followup in 1 year.   Loralie Champagne 09/18/2014 10:22 PM

## 2015-01-08 HISTORY — PX: VASECTOMY: SHX75

## 2015-02-07 ENCOUNTER — Ambulatory Visit (HOSPITAL_COMMUNITY)
Admission: RE | Admit: 2015-02-07 | Discharge: 2015-02-07 | Disposition: A | Discharge status: Home / Self Care | Payer: PRIVATE HEALTH INSURANCE | Source: Ambulatory Visit | Attending: Cardiology | Admitting: Cardiology

## 2015-02-07 DIAGNOSIS — Z953 Presence of xenogenic heart valve: Secondary | ICD-10-CM | POA: Insufficient documentation

## 2015-02-07 DIAGNOSIS — I77810 Thoracic aortic ectasia: Secondary | ICD-10-CM | POA: Diagnosis present

## 2015-02-07 DIAGNOSIS — I359 Nonrheumatic aortic valve disorder, unspecified: Secondary | ICD-10-CM

## 2015-02-07 MED ORDER — GADOBENATE DIMEGLUMINE 529 MG/ML IV SOLN
20.0000 mL | Freq: Once | INTRAVENOUS | Status: AC | PRN
  Administered 2015-02-07: 20 mL via INTRAVENOUS

## 2015-02-10 DIAGNOSIS — I359 Nonrheumatic aortic valve disorder, unspecified: Secondary | ICD-10-CM | POA: Diagnosis not present

## 2015-10-01 ENCOUNTER — Other Ambulatory Visit: Payer: Self-pay | Admitting: Cardiology

## 2015-10-01 DIAGNOSIS — I359 Nonrheumatic aortic valve disorder, unspecified: Secondary | ICD-10-CM

## 2015-10-01 DIAGNOSIS — I77810 Thoracic aortic ectasia: Secondary | ICD-10-CM

## 2016-02-05 ENCOUNTER — Other Ambulatory Visit: Payer: Self-pay | Admitting: Cardiology

## 2016-02-05 DIAGNOSIS — I77810 Thoracic aortic ectasia: Secondary | ICD-10-CM

## 2016-02-05 DIAGNOSIS — I359 Nonrheumatic aortic valve disorder, unspecified: Secondary | ICD-10-CM

## 2017-01-31 ENCOUNTER — Telehealth (HOSPITAL_COMMUNITY): Payer: Self-pay | Admitting: Cardiology

## 2017-01-31 NOTE — Telephone Encounter (Signed)
Called patient to schedule

## 2017-02-04 ENCOUNTER — Encounter (HOSPITAL_COMMUNITY): Payer: Self-pay | Admitting: Cardiology

## 2017-02-04 NOTE — Telephone Encounter (Signed)
Called patient to schedule yearly f/u with Dr. Aundra Dubin per staff message from Shirley Muscat, RN.  No VM on patient's phone.  Will try to call patient back at later time.

## 2017-02-14 ENCOUNTER — Telehealth (HOSPITAL_COMMUNITY): Payer: Self-pay | Admitting: Vascular Surgery

## 2017-02-14 NOTE — Telephone Encounter (Signed)
Returned pt call to make f/u appt w/ [facility] in March

## 2017-04-04 ENCOUNTER — Ambulatory Visit (HOSPITAL_COMMUNITY)
Admission: RE | Admit: 2017-04-04 | Discharge: 2017-04-04 | Disposition: A | Discharge status: Home / Self Care | Payer: PRIVATE HEALTH INSURANCE | Source: Ambulatory Visit | Attending: Cardiology | Admitting: Cardiology

## 2017-04-04 ENCOUNTER — Telehealth (HOSPITAL_COMMUNITY): Payer: Self-pay | Admitting: *Deleted

## 2017-04-04 ENCOUNTER — Encounter (HOSPITAL_COMMUNITY): Payer: Self-pay | Admitting: Cardiology

## 2017-04-04 VITALS — BP 117/80 | HR 73 | Wt 219.8 lb

## 2017-04-04 DIAGNOSIS — Z7982 Long term (current) use of aspirin: Secondary | ICD-10-CM | POA: Diagnosis not present

## 2017-04-04 DIAGNOSIS — E785 Hyperlipidemia, unspecified: Secondary | ICD-10-CM | POA: Diagnosis not present

## 2017-04-04 DIAGNOSIS — Z79899 Other long term (current) drug therapy: Secondary | ICD-10-CM | POA: Insufficient documentation

## 2017-04-04 DIAGNOSIS — Z09 Encounter for follow-up examination after completed treatment for conditions other than malignant neoplasm: Secondary | ICD-10-CM | POA: Diagnosis not present

## 2017-04-04 DIAGNOSIS — Z87442 Personal history of urinary calculi: Secondary | ICD-10-CM | POA: Diagnosis not present

## 2017-04-04 DIAGNOSIS — Z953 Presence of xenogenic heart valve: Secondary | ICD-10-CM

## 2017-04-04 DIAGNOSIS — Q231 Congenital insufficiency of aortic valve: Secondary | ICD-10-CM | POA: Diagnosis not present

## 2017-04-04 DIAGNOSIS — I77810 Thoracic aortic ectasia: Secondary | ICD-10-CM | POA: Diagnosis not present

## 2017-04-04 DIAGNOSIS — I712 Thoracic aortic aneurysm, without rupture, unspecified: Secondary | ICD-10-CM

## 2017-04-04 DIAGNOSIS — Z8249 Family history of ischemic heart disease and other diseases of the circulatory system: Secondary | ICD-10-CM | POA: Insufficient documentation

## 2017-04-04 LAB — CBC
HCT: 44.4 % (ref 39.0–52.0)
Hemoglobin: 15.5 g/dL (ref 13.0–17.0)
MCH: 31.1 pg (ref 26.0–34.0)
MCHC: 34.9 g/dL (ref 30.0–36.0)
MCV: 89.2 fL (ref 78.0–100.0)
Platelets: 202 10*3/uL (ref 150–400)
RBC: 4.98 MIL/uL (ref 4.22–5.81)
RDW: 12.2 % (ref 11.5–15.5)
WBC: 7.2 10*3/uL (ref 4.0–10.5)

## 2017-04-04 LAB — BASIC METABOLIC PANEL
Anion gap: 8 (ref 5–15)
BUN: 15 mg/dL (ref 6–20)
CO2: 24 mmol/L (ref 22–32)
Calcium: 9.6 mg/dL (ref 8.9–10.3)
Chloride: 105 mmol/L (ref 101–111)
Creatinine, Ser: 1.12 mg/dL (ref 0.61–1.24)
GFR calc Af Amer: >60 mL/min (ref >60)
GFR calc non Af Amer: >60 mL/min (ref >60)
Glucose, Bld: 86 mg/dL (ref 65–99)
Potassium: 4.3 mmol/L (ref 3.5–5.1)
Sodium: 137 mmol/L (ref 135–145)

## 2017-04-04 LAB — LIPID PANEL
Cholesterol: 165 mg/dL (ref 0–200)
HDL: 46 mg/dL (ref >40)
LDL Cholesterol: 104 mg/dL — ABNORMAL HIGH (ref 0–99)
Total CHOL/HDL Ratio: 3.6 RATIO
Triglycerides: 73 mg/dL (ref <150)
VLDL: 15 mg/dL (ref 0–40)

## 2017-04-04 NOTE — Patient Instructions (Signed)
Your physician has requested that you have a cardiac MRI. Cardiac MRI uses a computer to create images of your heart as its beating, producing both still and moving pictures of your heart and major blood vessels. For further information please visit [email or web address] Please follow the instruction sheet given to you today for more information.  Labs drawn today (if we do not call you, then your lab work was stable)   Your physician recommends that you schedule a follow-up appointment in: 1 year March 2020 Please Call an Schedule Appointment

## 2017-04-04 NOTE — Telephone Encounter (Signed)
Result Notes for Basic Metabolic Panel (BMET)   Notes recorded by Darron Doom, RN on 04/04/2017 at 2:55 PM EDT Patient called back and he is aware of results. ------  Notes recorded by Shirley Muscat, RN on 04/04/2017 at 2:15 PM EDT Left VM  ------  Notes recorded by Larey Dresser, MD on 04/04/2017 at 12:59 PM EDT LDL 104, this is better than prior. Please call him with results.

## 2017-04-06 NOTE — Progress Notes (Signed)
Patient ID: Corey Rose, male   DOB: 1975-03-23, 42 y.o.   MRN: [id number] PCP: Dr. Joylene Draft Cardiology: Dr. Aundra Dubin  42 yo with history of bicuspid valve disorder s/p AVR x 2 presents for followup of aortic valve disorder.  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  He had a homograft aortic valve placed at that time.  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  He had repeat AVR with a Freestyle Medtronic bioprosthetic valve in 2009. Both AVRs were at [facility] (Dr. Clementeen Graham).    Patient is generally doing well.  He had his last echo done in 4/18 at [facility], bioprosthetic aortic valved was well-seated with mild aortic insufficiency.  He exercises regularly and just ran the [facility] [place] in [place] without any problems.  No exertional dyspnea, chest pain, lightheadedness, syncope, or palpitations.  Weight is fairly stable.   ECG (personally reviewed): NSR, normal  Labs (12/13): K 4.5, creatinine 0.9, HDL 41, LDL 194, TSH normal Labs (3/16): LDL 111, HDL 44  PMH: 1. Hyperlipidemia: Myalgias with simvastatin. 2. 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 AVRs were both at [facility], Dr. Clementeen Graham.  Echo (1/14) with EF 55-60%, bioprosthetic aortic valve with mean gradient 5 mmHg.  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  Echo (3/16) with EF 55-60%, bioprosthetic aortic valve with mean gradient 4 mmHg and trivial AI, normal RV size and systolic function.  - Echo (4/18): EF 60-65%, well-seated bioprosthetic aortic valve with no stenosis, mild regurgitation.  - MRA chest (1/17) with 4.3 cm ascending aorta.  3. H/o prostatitis 4. H/o bladder stones  SH: Married, 2 children, lives in [place].  [name].    FH: Father with atrial fibrillation, grandfather with "heart disease."   ROS: All systems reviewed and negative except as per HPI.   Current Outpatient Medications  Medication Sig Dispense Refill  . aspirin 81 MG tablet Take 1 tablet (81 mg total) by mouth daily. 30 tablet 3  . escitalopram (LEXAPRO) 10 MG tablet Take 10 mg by mouth daily.    . rosuvastatin (CRESTOR) 5 MG tablet Take 5 mg by mouth daily.    . Tamsulosin HCl (FLOMAX) 0.4 MG CAPS Take 0.4 mg by mouth daily.     No current facility-administered medications for this encounter.    BP 117/80   Pulse 73   Wt 219 lb 12 oz (99.7 kg)   SpO2 98%   BMI 27.47 kg/m  General: NAD Neck: No JVD, no thyromegaly or thyroid nodule.  Lungs: Clear to auscultation bilaterally with normal respiratory effort. CV: Nondisplaced PMI.  Heart regular S1/S2, no S3/S4, 2/6 early diastolic murmur upper sternal border.  No peripheral edema.  No carotid bruit.  Normal pedal pulses.  Abdomen: Soft, nontender, no hepatosplenomegaly, no distention.  Skin: Intact without lesions or rashes.  Neurologic: Alert and oriented x 3.  Psych: Normal affect. Extremities: No clubbing or cyanosis.  HEENT: Normal.   Assessment/Plan: 1. Bicuspid aortic valve disorder: Patient had bicuspid aortic valve and developed endocarditis.  He had AVR initially in 1997 then developed a pseudoaneurysm of the aortic root and repeat AVR with a bioprosthetic valve in 2009.  Since that time, he has been doing well symptomatically.  Last echo in 4/18 showed a well-seated prosthetic valve with mild aortic insufficiency.  He had mild ascending aorta dilation on MRA chest in 2/14.  - I will arrange for repeat MRA chest to follow the ascending aorta size.  - Diastolic murmur seems more prominent to me.  He is scheduled for an echo at [facility] in 4/19, I will await this echo.  Need to pay close attention to peri-valvular aortic insufficiency.  - Needs antibiotic prophylaxis with  dental procedures.  2. Hyperlipidemia: Check lipids today, he is on Crestor.      Followup in 1 year if aortic valve is stable on 4/19 echo.    Loralie Champagne 04/06/2017 1:51 PM

## 2017-04-30 ENCOUNTER — Ambulatory Visit (HOSPITAL_COMMUNITY): Payer: PRIVATE HEALTH INSURANCE

## 2017-06-11 HISTORY — PX: ANOMALOUS PULMONARY VENOUS RETURN REPAIR, TOTAL: SHX1156

## 2017-07-17 ENCOUNTER — Telehealth (HOSPITAL_COMMUNITY): Payer: Self-pay | Admitting: *Deleted

## 2017-07-17 NOTE — Telephone Encounter (Signed)
Patient has been in [facility] center in [place] and [place] a Tourist information centre manager called asking to setup a follow up appointment with Dr. Aundra Dubin.  Patient will be discharged around July 21-22nd.    I scheduled patient for Dr. Claris Gladden next available on Sept 3 @ 11:40 and Claiborne Billings was happy with that.  She will fax records over to our office once discharged.  No further questions.

## 2017-08-04 ENCOUNTER — Telehealth (HOSPITAL_COMMUNITY): Payer: Self-pay | Admitting: *Deleted

## 2017-08-04 DIAGNOSIS — I712 Thoracic aortic aneurysm, without rupture, unspecified: Secondary | ICD-10-CM

## 2017-08-04 DIAGNOSIS — R188 Other ascites: Secondary | ICD-10-CM

## 2017-08-04 NOTE — Telephone Encounter (Signed)
Dr. Aundra Dubin has ordered for patient to have abdominal US for ascites and bmet.  Orders placed and sent to scheduling to contact patient for appointments.

## 2017-08-06 ENCOUNTER — Other Ambulatory Visit (HOSPITAL_COMMUNITY): Payer: Self-pay | Admitting: *Deleted

## 2017-08-06 DIAGNOSIS — I712 Thoracic aortic aneurysm, without rupture, unspecified: Secondary | ICD-10-CM

## 2017-08-06 NOTE — Addendum Note (Signed)
Addended by: Darron Doom on: 08/06/2017 02:11 PM   Modules accepted: Orders

## 2017-08-06 NOTE — Telephone Encounter (Signed)
Patient has been scheduled for TEE on Monday August 5th @ 1:00PM.  He is aware to go to Admissions at 11:30 after his abdominal US.  No further questions.

## 2017-08-06 NOTE — Telephone Encounter (Signed)
I reviewed Mr Corey Rose's echo from [facility] in [place].  Images are somewhat difficult but he has moderate to severe aortic insufficiency, appears peri-valvular around his TAVR valve.  EF 55-60%, RV function preserved.   I discussed results over the phone with the patient and his wife.  He is going to need valve surgery again it appears at some point in the future.  I am going to have him get a TEE to better evaluate the valve as the anatomy in the aortic root is complex.    Loralie Champagne 08/06/2017 10:14 AM

## 2017-08-08 ENCOUNTER — Ambulatory Visit (HOSPITAL_COMMUNITY): Payer: PRIVATE HEALTH INSURANCE

## 2017-08-08 ENCOUNTER — Telehealth (HOSPITAL_COMMUNITY): Payer: Self-pay | Admitting: *Deleted

## 2017-08-08 NOTE — Telephone Encounter (Signed)
TEE no pre cert reqd call ref #[id number]

## 2017-08-10 NOTE — Anesthesia Preprocedure Evaluation (Addendum)
Anesthesia Evaluation  Patient identified by MRN, date of birth, ID band Patient awake    Reviewed: Allergy & Precautions, NPO status , Patient's Chart, lab work & pertinent test results  Airway Mallampati: II  TM Distance: >3 FB Neck ROM: Full    Dental no notable dental hx. (+) Teeth Intact, Dental Advisory Given   Pulmonary neg pulmonary ROS, former smoker,    Pulmonary exam normal breath sounds clear to auscultation       Cardiovascular Exercise Tolerance: Good METS: 7 - 9 Mets negative cardio ROS Normal cardiovascular exam+ Valvular Problems/Murmurs AI  Rhythm:Regular Rate:Normal + Diastolic murmurs    Neuro/Psych negative neurological ROS  negative psych ROS   GI/Hepatic negative GI ROS, Neg liver ROS,   Endo/Other  negative endocrine ROS  Renal/GU negative Renal ROS     Musculoskeletal negative musculoskeletal ROS (+)   Abdominal   Peds  Hematology negative hematology ROS (+)   Anesthesia Other Findings   Reproductive/Obstetrics                             Anesthesia Physical Anesthesia Plan  ASA: II  Anesthesia Plan: MAC   Post-op Pain Management:    Induction: Intravenous  PONV Risk Score and Plan:   Airway Management Planned: Mask, Natural Airway and Nasal Cannula  Additional Equipment:   Intra-op Plan:   Post-operative Plan:   Informed Consent: I have reviewed the patients History and Physical, chart, labs and discussed the procedure including the risks, benefits and alternatives for the proposed anesthesia with the patient or authorized representative who has indicated his/her understanding and acceptance.     Plan Discussed with: CRNA  Anesthesia Plan Comments:         Anesthesia Quick Evaluation

## 2017-08-11 ENCOUNTER — Ambulatory Visit (HOSPITAL_COMMUNITY): Payer: PRIVATE HEALTH INSURANCE | Admitting: Anesthesiology

## 2017-08-11 ENCOUNTER — Ambulatory Visit (HOSPITAL_COMMUNITY)
Admission: RE | Admit: 2017-08-11 | Discharge: 2017-08-11 | Disposition: A | Discharge status: Home / Self Care | Payer: PRIVATE HEALTH INSURANCE | Source: Ambulatory Visit | Attending: Cardiology | Admitting: Cardiology

## 2017-08-11 ENCOUNTER — Encounter (HOSPITAL_COMMUNITY)
Admission: RE | Disposition: A | Discharge status: Home / Self Care | Payer: Self-pay | Source: Ambulatory Visit | Attending: Cardiology

## 2017-08-11 ENCOUNTER — Encounter (HOSPITAL_COMMUNITY): Payer: Self-pay

## 2017-08-11 ENCOUNTER — Other Ambulatory Visit (HOSPITAL_COMMUNITY): Payer: Self-pay | Admitting: *Deleted

## 2017-08-11 ENCOUNTER — Other Ambulatory Visit: Payer: Self-pay

## 2017-08-11 ENCOUNTER — Ambulatory Visit (HOSPITAL_COMMUNITY)
Admission: RE | Admit: 2017-08-11 | Discharge: 2017-08-11 | Disposition: A | Discharge status: Home / Self Care | Payer: PRIVATE HEALTH INSURANCE | Source: Ambulatory Visit | Attending: Internal Medicine | Admitting: Internal Medicine

## 2017-08-11 ENCOUNTER — Ambulatory Visit (HOSPITAL_COMMUNITY): Payer: PRIVATE HEALTH INSURANCE

## 2017-08-11 ENCOUNTER — Other Ambulatory Visit (HOSPITAL_COMMUNITY): Payer: Self-pay | Admitting: Cardiology

## 2017-08-11 DIAGNOSIS — R0600 Dyspnea, unspecified: Secondary | ICD-10-CM | POA: Insufficient documentation

## 2017-08-11 DIAGNOSIS — Z8249 Family history of ischemic heart disease and other diseases of the circulatory system: Secondary | ICD-10-CM | POA: Diagnosis not present

## 2017-08-11 DIAGNOSIS — Z7901 Long term (current) use of anticoagulants: Secondary | ICD-10-CM | POA: Insufficient documentation

## 2017-08-11 DIAGNOSIS — I061 Rheumatic aortic insufficiency: Secondary | ICD-10-CM | POA: Diagnosis not present

## 2017-08-11 DIAGNOSIS — Z888 Allergy status to other drugs, medicaments and biological substances status: Secondary | ICD-10-CM | POA: Diagnosis not present

## 2017-08-11 DIAGNOSIS — Z87891 Personal history of nicotine dependence: Secondary | ICD-10-CM | POA: Insufficient documentation

## 2017-08-11 DIAGNOSIS — Z7982 Long term (current) use of aspirin: Secondary | ICD-10-CM | POA: Diagnosis not present

## 2017-08-11 DIAGNOSIS — I712 Thoracic aortic aneurysm, without rupture, unspecified: Secondary | ICD-10-CM

## 2017-08-11 DIAGNOSIS — Z8673 Personal history of transient ischemic attack (TIA), and cerebral infarction without residual deficits: Secondary | ICD-10-CM | POA: Diagnosis not present

## 2017-08-11 DIAGNOSIS — R188 Other ascites: Secondary | ICD-10-CM

## 2017-08-11 DIAGNOSIS — Z952 Presence of prosthetic heart valve: Secondary | ICD-10-CM | POA: Insufficient documentation

## 2017-08-11 DIAGNOSIS — Z9889 Other specified postprocedural states: Secondary | ICD-10-CM | POA: Diagnosis not present

## 2017-08-11 DIAGNOSIS — I420 Dilated cardiomyopathy: Secondary | ICD-10-CM | POA: Insufficient documentation

## 2017-08-11 DIAGNOSIS — E785 Hyperlipidemia, unspecified: Secondary | ICD-10-CM | POA: Diagnosis not present

## 2017-08-11 DIAGNOSIS — Z79899 Other long term (current) drug therapy: Secondary | ICD-10-CM | POA: Diagnosis not present

## 2017-08-11 DIAGNOSIS — I352 Nonrheumatic aortic (valve) stenosis with insufficiency: Secondary | ICD-10-CM

## 2017-08-11 HISTORY — DX: Cerebral infarction, unspecified: I63.9

## 2017-08-11 HISTORY — PX: TEE WITHOUT CARDIOVERSION: SHX5443

## 2017-08-11 LAB — BASIC METABOLIC PANEL
Anion gap: 8 (ref 5–15)
Anion gap: 8 (ref 5–15)
BUN: 14 mg/dL (ref 6–20)
BUN: 13 mg/dL (ref 6–20)
CO2: 24 mmol/L (ref 22–32)
CO2: 26 mmol/L (ref 22–32)
Calcium: 9.1 mg/dL (ref 8.9–10.3)
Calcium: 9 mg/dL (ref 8.9–10.3)
Chloride: 108 mmol/L (ref 98–111)
Chloride: 107 mmol/L (ref 98–111)
Creatinine, Ser: 1.02 mg/dL (ref 0.61–1.24)
Creatinine, Ser: 1.02 mg/dL (ref 0.61–1.24)
GFR calc Af Amer: >60 mL/min (ref >60)
GFR calc Af Amer: >60 mL/min (ref >60)
GFR calc non Af Amer: >60 mL/min (ref >60)
GFR calc non Af Amer: >60 mL/min (ref >60)
Glucose, Bld: 92 mg/dL (ref 70–99)
Glucose, Bld: 92 mg/dL (ref 70–99)
Potassium: 4 mmol/L (ref 3.5–5.1)
Potassium: 4 mmol/L (ref 3.5–5.1)
Sodium: 140 mmol/L (ref 135–145)
Sodium: 141 mmol/L (ref 135–145)

## 2017-08-11 LAB — CBC
HCT: 38.6 % — ABNORMAL LOW (ref 39.0–52.0)
Hemoglobin: 12.2 g/dL — ABNORMAL LOW (ref 13.0–17.0)
MCH: 28.2 pg (ref 26.0–34.0)
MCHC: 31.6 g/dL (ref 30.0–36.0)
MCV: 89.1 fL (ref 78.0–100.0)
Platelets: 185 10*3/uL (ref 150–400)
RBC: 4.33 MIL/uL (ref 4.22–5.81)
RDW: 13.2 % (ref 11.5–15.5)
WBC: 4.5 10*3/uL (ref 4.0–10.5)

## 2017-08-11 LAB — PROTIME-INR
INR: 1.05
Prothrombin Time: 13.6 seconds (ref 11.4–15.2)

## 2017-08-11 SURGERY — ECHOCARDIOGRAM, TRANSESOPHAGEAL
Anesthesia: Monitor Anesthesia Care

## 2017-08-11 MED ORDER — LACTATED RINGERS IV SOLN
INTRAVENOUS | Status: DC
  Administered 2017-08-11 (×2): via INTRAVENOUS

## 2017-08-11 MED ORDER — PHENYLEPHRINE 40 MCG/ML (10ML) SYRINGE FOR IV PUSH (FOR BLOOD PRESSURE SUPPORT)
PREFILLED_SYRINGE | INTRAVENOUS | Status: DC | PRN
  Administered 2017-08-11 (×2): 80 ug via INTRAVENOUS

## 2017-08-11 MED ORDER — PROPOFOL 10 MG/ML IV BOLUS
INTRAVENOUS | Status: DC | PRN
  Administered 2017-08-11: 50 mg via INTRAVENOUS

## 2017-08-11 MED ORDER — PROPOFOL 500 MG/50ML IV EMUL
INTRAVENOUS | Status: DC | PRN
  Administered 2017-08-11: 100 ug/kg/min via INTRAVENOUS

## 2017-08-11 MED ORDER — EPHEDRINE SULFATE-NACL 50-0.9 MG/10ML-% IV SOSY
PREFILLED_SYRINGE | INTRAVENOUS | Status: DC | PRN
  Administered 2017-08-11 (×2): 10 mg via INTRAVENOUS

## 2017-08-11 MED ORDER — SODIUM CHLORIDE 0.9 % IV SOLN: INTRAVENOUS | Status: DC

## 2017-08-11 MED ORDER — LIDOCAINE 2% (20 MG/ML) 5 ML SYRINGE
INTRAMUSCULAR | Status: DC | PRN
  Administered 2017-08-11: 100 mg via INTRAVENOUS

## 2017-08-11 NOTE — Anesthesia Postprocedure Evaluation (Signed)
Anesthesia Post Note  Patient: Corey Rose  Procedure(s) Performed: TRANSESOPHAGEAL ECHOCARDIOGRAM (TEE) (N/A )     Patient location during evaluation: Endoscopy Anesthesia Type: MAC Level of consciousness: awake and alert Pain management: pain level controlled Vital Signs Assessment: post-procedure vital signs reviewed and stable Respiratory status: spontaneous breathing, nonlabored ventilation, respiratory function stable and patient connected to nasal cannula oxygen Cardiovascular status: stable and blood pressure returned to baseline Postop Assessment: no apparent nausea or vomiting Anesthetic complications: no    Last Vitals:  Vitals:   08/11/17 1335 08/11/17 1345  BP: (!) 106/49 (!) 103/51  Pulse: 82 78  Resp: 17 17  Temp: 36.6 C   SpO2: 97% 99%    Last Pain:  Vitals:   08/11/17 1345  TempSrc:   PainSc: 0-No pain                 Barnet Glasgow

## 2017-08-11 NOTE — Anesthesia Procedure Notes (Signed)
Procedure Name: MAC Date/Time: 08/11/2017 12:48 PM Performed by: Teressa Lower., CRNA Pre-anesthesia Checklist: Patient identified, Emergency Drugs available, Suction available, Patient being monitored and Timeout performed Patient Re-evaluated:Patient Re-evaluated prior to induction Oxygen Delivery Method: Nasal cannula

## 2017-08-11 NOTE — Transfer of Care (Signed)
Immediate Anesthesia Transfer of Care Note  Patient: Corey Rose  Procedure(s) Performed: TRANSESOPHAGEAL ECHOCARDIOGRAM (TEE) (N/A )  Patient Location: Endoscopy Unit  Anesthesia Type:MAC  Level of Consciousness: awake, alert  and oriented  Airway & Oxygen Therapy: Patient Spontanous Breathing and Patient connected to nasal cannula oxygen  Post-op Assessment: Report given to RN and Post -op Vital signs reviewed and stable  Post vital signs: Reviewed and stable  Last Vitals:  Vitals Value Taken Time  BP 106/49 08/11/2017  1:35 PM  Temp 36.6 C 08/11/2017  1:35 PM  Pulse 82 08/11/2017  1:35 PM  Resp 17 08/11/2017  1:35 PM  SpO2 97 % 08/11/2017  1:35 PM    Last Pain:  Vitals:   08/11/17 1335  TempSrc: Oral  PainSc:          Complications: No apparent anesthesia complications

## 2017-08-11 NOTE — Discharge Instructions (Signed)

## 2017-08-11 NOTE — H&P (Signed)
Advanced Heart Failure Team History and Physical Note   PCP:  Crist Infante, MD  PCP-Cardiology: No primary care provider on file.     Reason for Admission:    HPI:    Patient with history of recent complicated TAVR procedure presents for TEE to assess degree of aortic insufficiency.  He reports dyspnea walking up hills, otherwise no significant exertional symptoms.     Review of Systems:All negative except as noted in HPI  Home Medications Prior to Admission medications   Medication Sig Start Date End Date Taking? Authorizing Provider  aspirin 81 MG tablet Take 1 tablet (81 mg total) by mouth daily. Patient taking differently: Take 325 mg by mouth daily.  02/22/14  Yes Larey Dresser, MD  clopidogrel (PLAVIX) 75 MG tablet Take 75 mg by mouth daily.   Yes [provider]  escitalopram (LEXAPRO) 10 MG tablet Take 10 mg by mouth daily. 02/22/15  Yes [provider]  Lactobacillus Rhamnosus, GG, (CULTURELLE PO) Take 1 tablet by mouth daily.   Yes [provider]  Melatonin 3 MG TABS Take 6 mg by mouth at bedtime.   Yes [provider]  mirtazapine (REMERON) 7.5 MG tablet Take 7.5 mg by mouth at bedtime.   Yes [provider]  mupirocin cream (BACTROBAN) 2 % Apply 1 application topically 2 (two) times daily.   Yes [provider]  Rosuvastatin Calcium 10 MG CPSP Take 10 mg by mouth daily.    Yes [provider]  Tamsulosin HCl (FLOMAX) 0.4 MG CAPS Take 0.4 mg by mouth daily.   Yes [provider]    Past Medical History: Past Medical History:  Diagnosis Date  . History of aortic valve replacement 1997, 2009   . Hyperlipidemia   . Prostatitis 2010    Dr Merryl Hacker , [facility]  . Rosacea   . Stroke [facility])     Past Surgical History: Past Surgical History:  Procedure Laterality Date  . AORTIC VALVE REPLACEMENT  1997    [facility];post Endocarditis   . AORTIC VALVE REPLACEMENT  2009   [facility]; Dr Clementeen Graham  . bladder  stones removed  2010   [facility]    Family History:  Family History  Problem Relation Age of Onset  . Hyperlipidemia Mother   . Skin cancer Mother        ? melanoma, basal & squamous cell  . Hyperlipidemia Father   . Atrial fibrillation Father   . Skin cancer Father   . Stroke Neg Hx   . Diabetes Neg Hx   . Heart attack Neg Hx     Social History: Social History   Socioeconomic History  . Marital status: Married    Spouse name: Not on file  . Number of children: Not on file  . Years of education: Not on file  . Highest education level: Not on file  Occupational History  . Not on file  Social Needs  . Financial resource strain: Not on file  . Food insecurity:    Worry: Not on file    Inability: Not on file  . Transportation needs:    Medical: Not on file    Non-medical: Not on file  Tobacco Use  . Smoking status: Former Smoker    Start date: 01/07/1994    Last attempt to quit: 01/07/1994    Years since quitting: 23.6  . Smokeless tobacco: Never Used  . Tobacco comment: smoked 1990-1996, up to 1 pp week  Substance and  Sexual Activity  . Alcohol use: No    Alcohol/week: 0.0 oz    Comment:  quit 2009  . Drug use: No  . Sexual activity: Not on file  Lifestyle  . Physical activity:    Days per week: Not on file    Minutes per session: Not on file  . Stress: Not on file  Relationships  . Social connections:    Talks on phone: Not on file    Gets together: Not on file    Attends religious service: Not on file    Active member of club or organization: Not on file    Attends meetings of clubs or organizations: Not on file    Relationship status: Not on file  Other Topics Concern  . Not on file  Social History Narrative  . Not on file    Allergies:  Allergies  Allergen Reactions  . Zocor [Simvastatin]     Muscle pain    Objective:    Vital Signs:   Temp:  [97.6 F (36.4 C)] 97.6 F (36.4 C) (08/05 1209) Pulse Rate:  [73] 73 (08/05 1209) Resp:  [10] 10  (08/05 1209) BP: (108)/(56) 108/56 (08/05 1209) SpO2:  [100 %] 100 % (08/05 1209) Weight:  [220 lb (99.8 kg)] 220 lb (99.8 kg) (08/05 1209)   Filed Weights   08/11/17 1209  Weight: 220 lb (99.8 kg)     Physical Exam     General:  Well appearing. No respiratory difficulty HEENT: Normal Neck: Supple. no JVD. Carotids 2+ bilat; no bruits. No lymphadenopathy or thyromegaly appreciated. Cor: PMI nondisplaced. Regular rate & rhythm. Systolic and diastolic murmurs. Lungs: Clear Abdomen: Soft, nontender, nondistended. No hepatosplenomegaly. No bruits or masses. Good bowel sounds. Extremities: No cyanosis, clubbing, rash, edema Neuro: Alert & oriented x 3, cranial nerves grossly intact. moves all 4 extremities w/o difficulty. Affect pleasant.   Telemetry   NSR (personally reviewed)  Labs     Basic Metabolic Panel: Recent Labs  Lab 08/11/17 0855  NA 140  K 4.0  CL 108  CO2 24  GLUCOSE 92  BUN 14  CREATININE 1.02  CALCIUM 9.1    Liver Function Tests: No results for input(s): AST, ALT, ALKPHOS, BILITOT, PROT, ALBUMIN in the last 168 hours. No results for input(s): LIPASE, AMYLASE in the last 168 hours. No results for input(s): AMMONIA in the last 168 hours.  CBC: Recent Labs  Lab 08/11/17 0855  WBC 4.5  HGB 12.2*  HCT 38.6*  MCV 89.1  PLT 185    Cardiac Enzymes: No results for input(s): CKTOTAL, CKMB, CKMBINDEX, TROPONINI in the last 168 hours.  BNP: BNP (last 3 results) No results for input(s): BNP in the last 8760 hours.  ProBNP (last 3 results) No results for input(s): PROBNP in the last 8760 hours.   CBG: No results for input(s): GLUCAP in the last 168 hours.  Coagulation Studies: No results for input(s): LABPROT, INR in the last 72 hours.  Imaging: [place] Ascites (abdomen Limited)  Result Date: 08/11/2017 CLINICAL DATA:  Possible ascites EXAM: LIMITED ABDOMEN ULTRASOUND FOR ASCITES TECHNIQUE: Limited ultrasound survey for ascites was performed in  all four abdominal quadrants. COMPARISON:  None. FINDINGS: No significant ascites is noted.  A left renal cyst is seen. IMPRESSION: No evidence of ascites. Electronically Signed   By: Inez Catalina M.D.   On: 08/11/2017 11:32     Assessment/Plan   Patient with h/o AI and complicated TAVR.  Plan for TEE to assess  aortic valve.    Loralie Champagne, MD 08/11/2017, 12:59 PM  Advanced Heart Failure Team Pager [phone] (M-F; 7a - 4p)  Please contact [facility] for night-coverage after hours (4p -7a ) and weekends on [email or web address]

## 2017-08-11 NOTE — CV Procedure (Signed)
Procedure: TEE  Sedation: Propofol per anesthesiology  Findings: Please see echo section for full report.  Moderately dilated LV with normal wall thickness.  EF 55-60%.  Normal right ventricular size and systolic function.  Normal right atrium. Mild left atrial enlargement with no LA appendage thrombus. No PFO or ASD, negative bubble study.  Trivial TR. Trivial mitral regurgitation.  The patient is s/p valve-in-valve TAVR.  There is a bioprosthetic aortic valve present s/p TAVR.  Mean gradient across the aortic valve was 16 mmHg.  There was significant peri-valvular aortic insufficiency, would rate AI as severe overall.  There was systolic flow reversal in the descending thoracic aorta.  There was also a small jet of color across the intervalvular fibrosa that may be a small connection between the LVOT and LA, possibly from prior endocarditis.  The thoracic aorta was normal in caliber with mild plaque.  There was filamentous material of uncertain etiology noted in the aortic arch.   Impression: I am concerned for severe peri-valvular aortic insufficiency.  The AI seems to be fairly widespread around the TAVR valve, suspect that "plug" would not be sufficient.  Will need to look into options for correction.    Will send blood cultures with the filamentous material.   Corey Rose 08/11/2017 1:38 PM

## 2017-08-14 ENCOUNTER — Encounter (HOSPITAL_COMMUNITY): Payer: Self-pay | Admitting: *Deleted

## 2017-08-14 NOTE — Progress Notes (Signed)
Per Dr Aundra Dubin pt needs urgent referral to Dr Mali Hughes at [facility] for severe aortic insufficiency.  Called his office at [phone] and spoke w/Vicki on 08/13/17.  She states all records and disc w/tests need to be mailed to Dr Ysidro Evert, he will review and then they will contact pt for scheduling.  Recent records from our office and from [facility] along w/disc of TEE done 08/11/17 mailed to:  Dr Mali Hughes [facility] [address] [place], [place] [address]

## 2017-08-16 LAB — CULTURE, BLOOD (ROUTINE X 2)
Culture: NO GROWTH
Culture: NO GROWTH
Special Requests: ADEQUATE
Special Requests: ADEQUATE

## 2017-08-20 ENCOUNTER — Encounter (HOSPITAL_COMMUNITY): Payer: Self-pay | Admitting: *Deleted

## 2017-08-20 NOTE — Progress Notes (Signed)
Per Dr Aundra Dubin pt needs urgent referral to Dr Marlou Sa or Dr Blair Heys at [facility] for severe aortic insufficiency, needs aortic valve replacement surgery.  I called [facility] urgent referral line at [phone] and registered pt in their system, his mrn is [phone] and I was transferred to [facility] center [phone]).  They state they will have office review info and they will call pt with appt within the next 1-2 days, they will send [place] fax requesting records when needed.  Pt is aware of all the above.

## 2017-08-20 NOTE — Progress Notes (Signed)
Spoke w/Vicki at Dr Mali Hughes office to f/u, she states they have not received the referral in the mail yet but it can take a week or longer before they get things.  Will f/u again next week

## 2017-08-21 NOTE — Progress Notes (Signed)
[facility] emailed back stating they received records sent. Since we're the referring provider, they said we have to request records from [facility].   I called patient's wife back and she will contact [facility] to have them mail CD of recent cath to the [facility] to complete his referral packet.

## 2017-08-21 NOTE — Progress Notes (Signed)
Received Record request from [facility].  Requested records emailed today to Brooke Dare -- [email or web address].  Spoke with patient's wife and she will mail CD of TEE to them directly and this was documented in email.   Original request will be scanned to patient's electronic medical record.

## 2017-09-03 NOTE — Progress Notes (Signed)
Patient: Corey Rose  DOB: 05-13-1975 MRN: [id number] PCP: Crist Infante, MD  Referring Provider: [facility], [facility]  Patient Active Problem List   Diagnosis Date Noted  . Wound infection after surgery 09/04/2017  . Hyperlipidemia 12/09/2011  . Aortic valve disorder 09/30/2008     Subjective:  Corey Rose is a 42 y.o. male here today for evaluation of previous MRSA drainage from L groin.   [name] has a complicated cardiac history starting with bicuspid aortic valve with severe AI that was complicated by endocarditis  s/p homograft AVR at the age of 28. He then underwent open  Bioprosthetic replacement in 2009. He was admitted 06-11-17 for TAVR due to symptomatic regurgitation of the bioprosthetic valve. This was complicated by migration of the initial valve. Further complications in attempt to create a conduit to place a subsequent valve resulted in flash pulmonary edema with VFib arrest in the setting of sudden and severe AI. He required emergent VA ECMO cannulation until valve was appropriately positioned. He spent 15 days in the hospital for complications a/w cardiogenic shock. He was decannulated on 06-13-17 from peripheral sites. Neurologic function was in question and an MRI revealed innumerable acute/early subacute infarcts throughout b/l cerebral and cerebellar hemispheres as well as left aspect of pons due to central embolic source. He received 7 day course of ciprofloxacin for H.Influenzae during this stay for suspected ventilator associated pneumonia. He required prolonged ventilation and extubated 9d post op. He was discharged to [facility] facility for ongoing rehabilitation.   Per chart review it is described that he suffered from vascular injury to the left groin and hematoma s/p primary repair during ECMO decannulation. He was discharged to SNF/Rehab with sutures intact and instructed to leave x 3-5 weeks then consider removal (07-02-17 rec by Dr. Earlyne Iba).   07-08-17  placed on cephalexin x 7d due to concern for groin infection.   7-5 culture +MRSA (S-bactrim)  7-8 seen by [facility] Dr. Manual Meier  [phone]).    and increased bactrim to 2 DS tabs BID x 14d.   CTA of the pelvis w/ and w/o contrast 07-16-17 obtained due to abdominal pain and fever. Linear soft tissue thickening in the left groin adjacent to surgical clips extending into the left pelvis anteriorly to the left external iliac vessels suggestive of scarring. Intraperitoneal space unremarkable. No evidence of abscess at this time.   07-18-17 placed per ID rec on bactrim 1 DS tab either once or twice a day (chart discrepancy) x 10d   RECENT EVENTS OF NOTE: TEE recently with Dr. Aundra Dubin on 08-11-17 revealing LVEF 55-60% but unfortunately severe peri-valvular aortic insufficiency as well as a filamentous material of uncertain etiology noted in the aortic arch. One set of blood cultures were obtained at this time and were negative.   Today Corey Rose is feeling well. 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 He has some shortness of breath with hills/stairs and finds them nearly impossible at times. Denies any chills/fevers/night sweats and has not taken any antibiotics in over 1 month now. He tells me his groin has healed over well and he has no further drainage. He has an appointment for pre-op visit/studies and meets with his new surgeon in a few weeks with [facility].   Review of Systems  Constitutional: Negative for chills, diaphoresis, fever and malaise/fatigue.  HENT: Negative for tinnitus.   Eyes: Negative for blurred vision and photophobia.  Respiratory: Positive for shortness of breath. Negative for cough and sputum production.   Cardiovascular: Negative for chest pain and leg swelling.  Gastrointestinal: Negative for diarrhea, nausea and vomiting.  Genitourinary: Negative for dysuria.  Skin: Negative for rash.  Neurological: Negative for headaches.     Past Medical History:  Diagnosis Date  . History of aortic valve replacement 1997, 2009   . Hyperlipidemia   . Prostatitis 2010    Dr Merryl Hacker , [facility]  . Rosacea   . Stroke [facility])     Outpatient Medications Prior to Visit  Medication Sig Dispense Refill  . aspirin 81 MG tablet Take 1 tablet (81 mg total) by mouth daily. (Patient taking differently: Take 325 mg by mouth daily. ) 30 tablet 3  . clopidogrel (PLAVIX) 75 MG tablet Take 75 mg by mouth daily.    Marland Kitchen escitalopram (LEXAPRO) 10 MG tablet Take 10 mg by mouth daily.    . Lactobacillus Rhamnosus, GG, (CULTURELLE PO) Take 1 tablet by mouth daily.    . Melatonin 3 MG TABS Take 6 mg by mouth at bedtime.    . mirtazapine (REMERON) 7.5 MG tablet Take 7.5 mg by mouth at bedtime.    . mupirocin cream (BACTROBAN) 2 % Apply 1 application topically 2 (two) times daily.    . Rosuvastatin Calcium 10 MG CPSP Take 10 mg by mouth daily.     . Tamsulosin HCl (FLOMAX) 0.4 MG CAPS Take 0.4 mg by mouth daily.     No facility-administered medications prior to visit.      Allergies  Allergen Reactions  . Zocor [Simvastatin]     Muscle pain    Social History   Tobacco Use  . Smoking status: Former Smoker    Start date: 01/07/1994    Last attempt to quit: 01/07/1994    Years since quitting: 23.6  . Smokeless tobacco: Never Used  . Tobacco comment: smoked 1990-1996, up to 1 pp week  Substance Use Topics  . Alcohol use: No    Alcohol/week: 0.0 standard drinks    Comment:  quit 2009  . Drug use: No    Family History  Problem Relation Age of Onset  . Hyperlipidemia Mother   . Skin cancer Mother        ? melanoma, basal & squamous cell  . Hyperlipidemia Father   . Atrial fibrillation Father   . Skin cancer Father   . Stroke Neg Hx   . Diabetes Neg Hx   . Heart attack Neg Hx     Objective:   Vitals:   09/04/17 1016  BP: 122/76  Pulse: 73  Temp: 97.9 F (36.6 C)  Weight: 224 lb (101.6 kg)   Body mass index is 28  kg/m.  Physical Exam  Constitutional: He is oriented to person, place, and time. He appears well-developed and well-nourished.  Seated comfortably in chair during visit.   HENT:  Mouth/Throat: Oropharynx is clear and moist and mucous membranes are normal. Normal dentition. No dental abscesses.  Cardiovascular: Normal rate and regular rhythm.  Murmur heard.  Systolic murmur is present.  Diastolic murmur is present. Pulmonary/Chest: Effort normal and breath sounds normal.  Abdominal: Soft. He exhibits no distension. There is no tenderness.  Lymphadenopathy:    He has no cervical adenopathy.  Neurological: He is alert and oriented to person, place, and time.  Skin: Skin is warm and dry. No rash noted.     Psychiatric: He has a normal mood and affect. Judgment  normal.  In good spirits today and engaged in care discussion.    Lab Results: Lab Results  Component Value Date   WBC 4.5 08/11/2017   HGB 12.2 (L) 08/11/2017   HCT 38.6 (L) 08/11/2017   MCV 89.1 08/11/2017   PLT 185 08/11/2017    Lab Results  Component Value Date   CREATININE 1.02 08/11/2017   BUN 13 08/11/2017   NA 141 08/11/2017   K 4.0 08/11/2017   CL 107 08/11/2017   CO2 26 08/11/2017    Lab Results  Component Value Date   ALT 18 03/21/2014   AST 20 03/21/2014   ALKPHOS 57 03/21/2014   BILITOT 0.5 03/21/2014     Assessment & Plan:   Problem List Items Addressed This Visit      Cardiovascular and Mediastinum   Aortic valve disorder    TEE confirming perivalvular leaking. Systolic/diastolic murmurs on exam today. Prepping for valve replacement at [facility] soon organized by Dr. Aundra Dubin and his team.         Other   Wound infection after surgery    Left groin from previous cut down with peripheral ECMO cannulation well healed and without any evidence of infection today. 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 He has been off antibiotics for at least 4 weeks no  and no symptoms that would be concerning for relapsed infection.  We talked about Mupiricin prior to surgery to de-colonize with h/o MRSA infection.   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 I gave him my card and offered follow up in the future if needed.         Return if symptoms worsen or fail to improve.  Janene Madeira, MSN, NP-C [facility] Pager: [phone] Office: [phone]  09/04/17  3:08 PM

## 2017-09-04 ENCOUNTER — Ambulatory Visit: Payer: PRIVATE HEALTH INSURANCE | Admitting: Infectious Diseases

## 2017-09-04 ENCOUNTER — Encounter: Payer: Self-pay | Admitting: Infectious Diseases

## 2017-09-04 VITALS — BP 122/76 | HR 73 | Temp 97.9°F | Wt 224.0 lb

## 2017-09-04 DIAGNOSIS — T8149XA Infection following a procedure, other surgical site, initial encounter: Secondary | ICD-10-CM

## 2017-09-04 DIAGNOSIS — I359 Nonrheumatic aortic valve disorder, unspecified: Secondary | ICD-10-CM | POA: Diagnosis not present

## 2017-09-04 NOTE — Assessment & Plan Note (Addendum)
TEE confirming perivalvular leaking. Systolic/diastolic murmurs on exam today. Prepping for valve replacement at [facility] soon organized by Dr. Aundra Dubin and his team.

## 2017-09-04 NOTE — Patient Instructions (Signed)
Very nice to meet you today!  I think your previous infection has cleared up nicely and you do not need any further treatment at this time.   We are happy to see you back in the future should you have any needs. Please look into your MyChart to app - this is a wonderful way to see your labs and set up email communication with our clinic for non-urgent medical concerns.    Wishing you luck with your upcoming surgery!   Would make sure they check your nose for staph bacteria before your surgery to help prevent infections.   Here is information about the 2 vaccines I would recommend.   Pneumococcal Conjugate Vaccine suspension for injection What is this medicine? PNEUMOCOCCAL VACCINE (NEU mo KOK al vak SEEN) is a vaccine used to prevent pneumococcus bacterial infections. These bacteria can cause serious infections like pneumonia, meningitis, and blood infections. This vaccine will lower your chance of getting pneumonia. If you do get pneumonia, it can make your symptoms milder and your illness shorter. This vaccine will not treat an infection and will not cause infection. This vaccine is recommended for infants and young children, adults with certain medical conditions, and adults 22 years or older. This medicine may be used for other purposes; ask your health care provider or pharmacist if you have questions. COMMON BRAND NAME(S): Prevnar, Prevnar 13 What should I tell my health care provider before I take this medicine? They need to know if you have any of these conditions: -bleeding problems -fever -immune system problems -an unusual or allergic reaction to pneumococcal vaccine, diphtheria toxoid, other vaccines, latex, other medicines, foods, dyes, or preservatives -pregnant or trying to get pregnant -breast-feeding How should I use this medicine? This vaccine is for injection into a muscle. It is given by a health care professional. A copy of Vaccine Information Statements will be given  before each vaccination. Read this sheet carefully each time. The sheet may change frequently. Talk to your pediatrician regarding the use of this medicine in children. While this drug may be prescribed for children as young as 3 weeks old for selected conditions, precautions do apply. Overdosage: If you think you have taken too much of this medicine contact a poison control center or emergency room at once. NOTE: This medicine is only for you. Do not share this medicine with others. What if I miss a dose? It is important not to miss your dose. Call your doctor or health care professional if you are unable to keep an appointment. What may interact with this medicine? -medicines for cancer chemotherapy -medicines that suppress your immune function -steroid medicines like prednisone or cortisone This list may not describe all possible interactions. Give your health care provider a list of all the medicines, herbs, non-prescription drugs, or dietary supplements you use. Also tell them if you smoke, drink alcohol, or use illegal drugs. Some items may interact with your medicine. What should I watch for while using this medicine? Mild fever and pain should go away in 3 days or less. Report any unusual symptoms to your doctor or health care professional. What side effects may I notice from receiving this medicine? Side effects that you should report to your doctor or health care professional as soon as possible: -allergic reactions like skin rash, itching or hives, swelling of the face, lips, or tongue -breathing problems -confused -fast or irregular heartbeat -fever over 102 degrees F -seizures -unusual bleeding or bruising -unusual muscle weakness Side effects that  usually do not require medical attention (report to your doctor or health care professional if they continue or are bothersome): -aches and pains -diarrhea -fever of 102 degrees F or less -headache -irritable -loss of  appetite -pain, tender at site where injected -trouble sleeping This list may not describe all possible side effects. Call your doctor for medical advice about side effects. You may report side effects to FDA at 1-800-FDA-1088. Where should I keep my medicine? This does not apply. This vaccine is given in a clinic, pharmacy, doctor's office, or other health care setting and will not be stored at home. NOTE: This sheet is a summary. It may not cover all possible information. If you have questions about this medicine, talk to your doctor, pharmacist, or health care provider.  2018 Elsevier/Gold Standard (2013-09-30 10:27:27)   Pneumococcal Vaccine, Polyvalent solution for injection What is this medicine? PNEUMOCOCCAL VACCINE, POLYVALENT (NEU mo KOK al vak SEEN, pol ee VEY luhnt) is a vaccine to prevent pneumococcus bacteria infection. These bacteria are a major cause of ear infections, Strep throat infections, and serious pneumonia, meningitis, or blood infections worldwide. These vaccines help the body to produce antibodies (protective substances) that help your body defend against these bacteria. This vaccine is recommended for people 54 years of age and older with health problems. It is also recommended for all adults over 58 years old. This vaccine will not treat an infection. This medicine may be used for other purposes; ask your health care provider or pharmacist if you have questions. COMMON BRAND NAME(S): Pneumovax 23 What should I tell my health care provider before I take this medicine? They need to know if you have any of these conditions: -bleeding problems -bone marrow or organ transplant -cancer, Hodgkin's disease -fever -infection -immune system problems -low platelet count in the blood -seizures -an unusual or allergic reaction to pneumococcal vaccine, diphtheria toxoid, other vaccines, latex, other medicines, foods, dyes, or preservatives -pregnant or trying to get  pregnant -breast-feeding How should I use this medicine? This vaccine is for injection into a muscle or under the skin. It is given by a health care professional. A copy of Vaccine Information Statements will be given before each vaccination. Read this sheet carefully each time. The sheet may change frequently. Talk to your pediatrician regarding the use of this medicine in children. While this drug may be prescribed for children as young as 55 years of age for selected conditions, precautions do apply. Overdosage: If you think you have taken too much of this medicine contact a poison control center or emergency room at once. NOTE: This medicine is only for you. Do not share this medicine with others. What if I miss a dose? It is important not to miss your dose. Call your doctor or health care professional if you are unable to keep an appointment. What may interact with this medicine? -medicines for cancer chemotherapy -medicines that suppress your immune function -medicines that treat or prevent blood clots like warfarin, enoxaparin, and dalteparin -steroid medicines like prednisone or cortisone This list may not describe all possible interactions. Give your health care provider a list of all the medicines, herbs, non-prescription drugs, or dietary supplements you use. Also tell them if you smoke, drink alcohol, or use illegal drugs. Some items may interact with your medicine. What should I watch for while using this medicine? Mild fever and pain should go away in 3 days or less. Report any unusual symptoms to your doctor or health care professional. What  side effects may I notice from receiving this medicine? Side effects that you should report to your doctor or health care professional as soon as possible: -allergic reactions like skin rash, itching or hives, swelling of the face, lips, or tongue -breathing problems -confused -fever over 102 degrees F -pain, tingling, numbness in the hands  or feet -seizures -unusual bleeding or bruising -unusual muscle weakness Side effects that usually do not require medical attention (report to your doctor or health care professional if they continue or are bothersome): -aches and pains -diarrhea -fever of 102 degrees F or less -headache -irritable -loss of appetite -pain, tender at site where injected -trouble sleeping This list may not describe all possible side effects. Call your doctor for medical advice about side effects. You may report side effects to FDA at 1-800-FDA-1088. Where should I keep my medicine? This does not apply. This vaccine is given in a clinic, pharmacy, doctor's office, or other health care setting and will not be stored at home. NOTE: This sheet is a summary. It may not cover all possible information. If you have questions about this medicine, talk to your doctor, pharmacist, or health care provider.  2018 Elsevier/Gold Standard (2007-07-31 14:32:37)

## 2017-09-04 NOTE — Assessment & Plan Note (Addendum)
Left groin from previous cut down with peripheral ECMO cannulation well healed and without any evidence of infection today. 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 He has been off antibiotics for at least 4 weeks no and no symptoms that would be concerning for relapsed infection.  We talked about Mupiricin prior to surgery to de-colonize with h/o MRSA infection.   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 I gave him my card and offered follow up in the future if needed.

## 2017-09-09 ENCOUNTER — Encounter (HOSPITAL_COMMUNITY): Payer: PRIVATE HEALTH INSURANCE | Admitting: Cardiology

## 2017-09-19 HISTORY — PX: MITRAL VALVE SURGERY: SHX714

## 2017-09-25 ENCOUNTER — Telehealth: Payer: Self-pay | Admitting: Pharmacist

## 2017-09-25 MED ORDER — ROSUVASTATIN CALCIUM 5 MG PO TABS
5.00 | ORAL_TABLET | ORAL | Status: DC
Start: 2017-09-25 — End: 2017-09-25

## 2017-09-25 MED ORDER — METOCLOPRAMIDE HCL 5 MG/ML IJ SOLN
10.00 | INTRAMUSCULAR | Status: DC
Start: ? — End: 2017-09-25

## 2017-09-25 MED ORDER — ESCITALOPRAM OXALATE 10 MG PO TABS
10.00 | ORAL_TABLET | ORAL | Status: DC
Start: 2017-09-26 — End: 2017-09-25

## 2017-09-25 MED ORDER — GENERIC EXTERNAL MEDICATION
1.00 | Status: DC
Start: ? — End: 2017-09-25

## 2017-09-25 MED ORDER — POLYETHYLENE GLYCOL 3350 17 G PO PACK
17.00 | PACK | ORAL | Status: DC
Start: 2017-09-26 — End: 2017-09-25

## 2017-09-25 MED ORDER — ALPRAZOLAM 0.25 MG PO TABS
0.25 | ORAL_TABLET | ORAL | Status: DC
Start: ? — End: 2017-09-25

## 2017-09-25 MED ORDER — ONCOVITE PO TABS
1.00 | ORAL_TABLET | ORAL | Status: DC
Start: 2017-09-26 — End: 2017-09-25

## 2017-09-25 MED ORDER — GENERIC EXTERNAL MEDICATION
Status: DC
Start: ? — End: 2017-09-25

## 2017-09-25 MED ORDER — MELATONIN 3 MG PO TABS
6.00 | ORAL_TABLET | ORAL | Status: DC
Start: 2017-09-25 — End: 2017-09-25

## 2017-09-25 MED ORDER — BISACODYL 10 MG RE SUPP
10.00 | RECTAL | Status: DC
Start: ? — End: 2017-09-25

## 2017-09-25 MED ORDER — TAMSULOSIN HCL 0.4 MG PO CAPS
0.40 | ORAL_CAPSULE | ORAL | Status: DC
Start: 2017-09-26 — End: 2017-09-25

## 2017-09-25 MED ORDER — MIRTAZAPINE 15 MG PO TABS
7.50 | ORAL_TABLET | ORAL | Status: DC
Start: 2017-09-25 — End: 2017-09-25

## 2017-09-25 MED ORDER — WARFARIN SODIUM 2.5 MG PO TABS
2.50 | ORAL_TABLET | ORAL | Status: DC
Start: 2017-09-25 — End: 2017-09-25

## 2017-09-25 MED ORDER — ALUMINUM-MAGNESIUM-SIMETHICONE 200-200-20 MG/5ML PO SUSP
30.00 | ORAL | Status: DC
Start: ? — End: 2017-09-25

## 2017-09-25 MED ORDER — POTASSIUM CHLORIDE CRYS ER 10 MEQ PO TBCR
10.00 | EXTENDED_RELEASE_TABLET | ORAL | Status: DC
Start: ? — End: 2017-09-25

## 2017-09-25 MED ORDER — PANTOPRAZOLE SODIUM 20 MG PO TBEC
20.00 | DELAYED_RELEASE_TABLET | ORAL | Status: DC
Start: 2017-09-26 — End: 2017-09-25

## 2017-09-25 MED ORDER — POTASSIUM CHLORIDE CRYS ER 10 MEQ PO TBCR
10.00 | EXTENDED_RELEASE_TABLET | ORAL | Status: DC
Start: 2017-09-26 — End: 2017-09-25

## 2017-09-25 MED ORDER — FUROSEMIDE 20 MG PO TABS
20.00 | ORAL_TABLET | ORAL | Status: DC
Start: 2017-09-25 — End: 2017-09-25

## 2017-09-25 MED ORDER — ONDANSETRON HCL 4 MG/2ML IJ SOLN
4.00 | INTRAMUSCULAR | Status: DC
Start: ? — End: 2017-09-25

## 2017-09-25 MED ORDER — OXYCODONE HCL 5 MG PO TABS
5.00 | ORAL_TABLET | ORAL | Status: DC
Start: ? — End: 2017-09-25

## 2017-09-25 MED ORDER — SENNOSIDES-DOCUSATE SODIUM 8.6-50 MG PO TABS
1.00 | ORAL_TABLET | ORAL | Status: DC
Start: 2017-09-25 — End: 2017-09-25

## 2017-09-25 MED ORDER — MAGNESIUM HYDROXIDE 400 MG/5ML PO SUSP
30.00 | ORAL | Status: DC
Start: ? — End: 2017-09-25

## 2017-09-25 MED ORDER — BENZOCAINE-MENTHOL 15-3.6 MG MT LOZG
1.00 | LOZENGE | OROMUCOSAL | Status: DC
Start: ? — End: 2017-09-25

## 2017-09-25 MED ORDER — ACETAMINOPHEN 325 MG PO TABS
650.00 | ORAL_TABLET | ORAL | Status: DC
Start: ? — End: 2017-09-25

## 2017-09-25 MED ORDER — GENERIC EXTERNAL MEDICATION
500.00 | Status: DC
Start: ? — End: 2017-09-25

## 2017-09-25 MED ORDER — SODIUM CHLORIDE FLUSH 0.9 % IV SOLN
3.00 | INTRAVENOUS | Status: DC
Start: 2017-09-25 — End: 2017-09-25

## 2017-09-25 MED ORDER — GENERIC EXTERNAL MEDICATION
75.00 | Status: DC
Start: 2017-09-25 — End: 2017-09-25

## 2017-09-25 MED ORDER — TRAMADOL HCL 50 MG PO TABS
50.00 | ORAL_TABLET | ORAL | Status: DC
Start: ? — End: 2017-09-25

## 2017-09-25 MED ORDER — GUAIFENESIN ER 600 MG PO TB12
600.00 | ORAL_TABLET | ORAL | Status: DC
Start: ? — End: 2017-09-25

## 2017-09-25 NOTE — Telephone Encounter (Signed)
Annette from [facility] called to make pt INR check for next week. He is being discharged from hospital tomorrow and will have INR check in outpatient clinic then. INRs and doses as below (records to be faxed to our office). Have made him a new coumadin appt on Monday to be est in our clinic.   9/14 2mg  INR 1.3 9/15 3mg  INR 1.1 9/16 5mg  INR 1.1 9/17 5mg   INR 1.3 9/18 5mg  INR 1.6 Today 2.5mg  INR 2.4 Likely home on 5mg  alternating with 2.5mg  per Anne Ng

## 2017-09-26 ENCOUNTER — Telehealth (HOSPITAL_COMMUNITY): Payer: Self-pay | Admitting: *Deleted

## 2017-09-26 DIAGNOSIS — Z09 Encounter for follow-up examination after completed treatment for conditions other than malignant neoplasm: Secondary | ICD-10-CM

## 2017-09-26 DIAGNOSIS — Z953 Presence of xenogenic heart valve: Principal | ICD-10-CM

## 2017-09-26 NOTE — Telephone Encounter (Signed)
Per Dr Aundra Dubin pt needs echo prior to his appt w/him next Fri, order placed will arrange

## 2017-09-29 ENCOUNTER — Ambulatory Visit (INDEPENDENT_AMBULATORY_CARE_PROVIDER_SITE_OTHER): Payer: PRIVATE HEALTH INSURANCE | Admitting: *Deleted

## 2017-09-29 DIAGNOSIS — Z7901 Long term (current) use of anticoagulants: Secondary | ICD-10-CM

## 2017-09-29 DIAGNOSIS — I359 Nonrheumatic aortic valve disorder, unspecified: Secondary | ICD-10-CM | POA: Diagnosis not present

## 2017-09-29 DIAGNOSIS — I639 Cerebral infarction, unspecified: Secondary | ICD-10-CM | POA: Insufficient documentation

## 2017-09-29 LAB — POCT INR: INR: 1.9 — AB (ref 2.0–3.0)

## 2017-09-29 NOTE — Patient Instructions (Addendum)
A full discussion of the nature of anticoagulants has been carried out.  A benefit risk analysis has been presented to the patient, so that they understand the justification for choosing anticoagulation at this time. The need for frequent and regular monitoring, precise dosage adjustment and compliance is stressed.  Side effects of potential bleeding are discussed.  The patient should avoid any OTC items containing aspirin or ibuprofen, and should avoid great swings in general diet.  Avoid alcohol consumption.  Call if any signs of abnormal bleeding.    Description   Start taking 2 tablets (5mg ) daily.  Recheck on Friday.

## 2017-09-30 ENCOUNTER — Encounter (HOSPITAL_COMMUNITY): Payer: PRIVATE HEALTH INSURANCE | Admitting: Cardiology

## 2017-10-03 ENCOUNTER — Ambulatory Visit (HOSPITAL_BASED_OUTPATIENT_CLINIC_OR_DEPARTMENT_OTHER)
Admission: RE | Admit: 2017-10-03 | Discharge: 2017-10-03 | Disposition: A | Discharge status: Home / Self Care | Payer: PRIVATE HEALTH INSURANCE | Source: Ambulatory Visit

## 2017-10-03 ENCOUNTER — Ambulatory Visit (INDEPENDENT_AMBULATORY_CARE_PROVIDER_SITE_OTHER): Payer: PRIVATE HEALTH INSURANCE | Admitting: *Deleted

## 2017-10-03 ENCOUNTER — Ambulatory Visit (HOSPITAL_COMMUNITY)
Admission: RE | Admit: 2017-10-03 | Discharge: 2017-10-03 | Disposition: A | Discharge status: Home / Self Care | Payer: PRIVATE HEALTH INSURANCE | Source: Ambulatory Visit | Attending: Cardiology | Admitting: Cardiology

## 2017-10-03 VITALS — BP 110/76 | HR 67 | Wt 221.8 lb

## 2017-10-03 DIAGNOSIS — Z79899 Other long term (current) drug therapy: Secondary | ICD-10-CM | POA: Diagnosis not present

## 2017-10-03 DIAGNOSIS — Z952 Presence of prosthetic heart valve: Secondary | ICD-10-CM | POA: Insufficient documentation

## 2017-10-03 DIAGNOSIS — Z8673 Personal history of transient ischemic attack (TIA), and cerebral infarction without residual deficits: Secondary | ICD-10-CM | POA: Diagnosis not present

## 2017-10-03 DIAGNOSIS — Z8249 Family history of ischemic heart disease and other diseases of the circulatory system: Secondary | ICD-10-CM | POA: Insufficient documentation

## 2017-10-03 DIAGNOSIS — Z7982 Long term (current) use of aspirin: Secondary | ICD-10-CM | POA: Diagnosis not present

## 2017-10-03 DIAGNOSIS — Z09 Encounter for follow-up examination after completed treatment for conditions other than malignant neoplasm: Secondary | ICD-10-CM

## 2017-10-03 DIAGNOSIS — E785 Hyperlipidemia, unspecified: Secondary | ICD-10-CM | POA: Diagnosis not present

## 2017-10-03 DIAGNOSIS — Q231 Congenital insufficiency of aortic valve: Secondary | ICD-10-CM | POA: Insufficient documentation

## 2017-10-03 DIAGNOSIS — I359 Nonrheumatic aortic valve disorder, unspecified: Secondary | ICD-10-CM | POA: Diagnosis not present

## 2017-10-03 DIAGNOSIS — Z7901 Long term (current) use of anticoagulants: Secondary | ICD-10-CM | POA: Diagnosis not present

## 2017-10-03 DIAGNOSIS — Z953 Presence of xenogenic heart valve: Secondary | ICD-10-CM | POA: Diagnosis not present

## 2017-10-03 LAB — BASIC METABOLIC PANEL
Anion gap: 10 (ref 5–15)
BUN: 13 mg/dL (ref 6–20)
CO2: 23 mmol/L (ref 22–32)
Calcium: 9.1 mg/dL (ref 8.9–10.3)
Chloride: 104 mmol/L (ref 98–111)
Creatinine, Ser: 0.94 mg/dL (ref 0.61–1.24)
GFR calc Af Amer: >60 mL/min (ref >60)
GFR calc non Af Amer: >60 mL/min (ref >60)
Glucose, Bld: 94 mg/dL (ref 70–99)
Potassium: 4.2 mmol/L (ref 3.5–5.1)
Sodium: 137 mmol/L (ref 135–145)

## 2017-10-03 LAB — POCT INR: INR: 2.3 (ref 2.0–3.0)

## 2017-10-03 LAB — CBC
HCT: 33.3 % — ABNORMAL LOW (ref 39.0–52.0)
Hemoglobin: 10.2 g/dL — ABNORMAL LOW (ref 13.0–17.0)
MCH: 26.4 pg (ref 26.0–34.0)
MCHC: 30.6 g/dL (ref 30.0–36.0)
MCV: 86.3 fL (ref 78.0–100.0)
Platelets: 482 10*3/uL — ABNORMAL HIGH (ref 150–400)
RBC: 3.86 MIL/uL — ABNORMAL LOW (ref 4.22–5.81)
RDW: 13.8 % (ref 11.5–15.5)
WBC: 10.4 10*3/uL (ref 4.0–10.5)

## 2017-10-03 MED ORDER — ASPIRIN 81 MG PO TABS: 81.0000 mg | ORAL_TABLET | Freq: Every day | ORAL | 3 refills | Status: AC

## 2017-10-03 MED ORDER — METOPROLOL SUCCINATE ER 50 MG PO TB24: 50.0000 mg | ORAL_TABLET | Freq: Two times a day (BID) | ORAL | 3 refills | Status: DC

## 2017-10-03 NOTE — Patient Instructions (Signed)
Take Aspirin 81 mg daily  Decrease Metoprolol to 50 mg Twice daily   Labs today  Your physician recommends that you schedule a follow-up appointment in: 2 weeks

## 2017-10-03 NOTE — Progress Notes (Signed)
  Echocardiogram 2D Echocardiogram has been performed.  Corey Rose 10/03/2017, 10:24 AM

## 2017-10-03 NOTE — Patient Instructions (Signed)
Description   Continue taking 2 tablets (5mg ) daily.  Recheck in 1 week. Call Coumadin Clinic [phone] with new medications, bleeding or questions.

## 2017-10-05 NOTE — Progress Notes (Signed)
Patient ID: Corey Rose, male   DOB: 1975-05-19, 42 y.o.   MRN: [id number] PCP: Dr. Joylene Draft Cardiology: Dr. Aundra Dubin  42 y.o.with history of bicuspid valve disorder s/p AVR x 3 presents for followup of aortic valve disorder.  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  He had a homograft aortic valve placed at that time.  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  He had repeat AVR with a Freestyle Medtronic bioprosthetic valve in 2009. Both initial AVRs were at [facility] (Dr. Clementeen Graham).    In 2019, he was noted to have a louder diastolic murmur and significant aortic insufficiency was noted.  He was seen again at [facility], and decision was made to undergo TAVR.  Initial TAVR valve was undersized and migrated.  A wall stent was used to crush this valve against the wall of the aortic root, leading to severe AI.  The patient developed flash pulmonary edema and was put on ECMO for several days. After wall stent was placed, a Medtronic Corevalve was deployed in the aortic position.  The patient was taken off ECMO.  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 He underwent extensive rehab at the [facility] in [place] and seems to have recovered nearly fully from strokes.   Repeat TEE in 8/19 showed severe peri-valvular aortic insufficiency around the Medtronic Corevalve prosthesis with preserved EF.   Patient was then seen at the [facility].  He had a 3rd sternotomy with removal of the 2 TAVR valves and wall stent, Bentall procedure with mechanical aortic valve and reimplantation of the coronaries.    He is seen post-operatively today.  He is doing very well.  He is walking up to 2 miles daily.  No lightheadedness/syncope/palpitations.  No chest pain.  Overall feeling good.   Echo done today showed EF 50-55% (low normal to mildly decreased), mildly dilated RV with mildly decreased systolic function,  normal mechanical aortic valve.   CXR today was reviewed, appeared normal.   ECG (personally reviewed): NSR, normal  Labs (12/13): K 4.5, creatinine 0.9, HDL 41, LDL 194, TSH normal Labs (3/16): LDL 111, HDL 44  PMH: 1. Hyperlipidemia: Myalgias with simvastatin. 2. 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 AVRs were both at [facility], Dr. Clementeen Graham.  Echo (1/14) with EF 55-60%, bioprosthetic aortic valve with mean gradient 5 mmHg.  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  Echo (3/16) with EF 55-60%, bioprosthetic aortic valve with mean gradient 4 mmHg and trivial AI, normal RV size and systolic function.  - Echo (4/18): EF 60-65%, well-seated bioprosthetic aortic valve with no stenosis, mild regurgitation.  - MRA chest (1/17) with 4.3 cm ascending aorta.  - Development of severe AI in 2019 => he had a complicated procedure in 6/19 with failed initial TAVR with migration of valve followed by wall stent followed Medtronic Corevalve.   - TAVR valve noted to have severe peri-valvular regurgitation on 8/19 TEE.  - In 9/19, He had a 3rd sternotomy with removal of the 2 TAVR valves and wall stent, Bentall procedure with mechanical aortic valve and reimplantation of the coronaries.  - Echo (9/19) showed EF 50-55% (low normal to mildly decreased), mildly dilated RV with  mildly decreased systolic function, normal mechanical aortic valve.  3. H/o prostatitis 4. H/o bladder stones 5. CVAs: Peri-operatively with failed TAVR in 6/19.   SH: Married, 2 children, lives in [place].  Owns [employer]   FH: Father with atrial fibrillation, grandfather with "heart disease."   ROS: All systems reviewed and negative except as per HPI.   Current Outpatient Medications  Medication Sig Dispense Refill  .  aspirin 81 MG tablet Take 1 tablet (81 mg total) by mouth daily. 30 tablet 3  . escitalopram (LEXAPRO) 10 MG tablet Take 10 mg by mouth daily.    . Lactobacillus Rhamnosus, GG, (CULTURELLE PO) Take 1 tablet by mouth daily.    . Melatonin 3 MG TABS Take 6 mg by mouth at bedtime.    . mirtazapine (REMERON) 7.5 MG tablet Take 7.5 mg by mouth at bedtime.    . mupirocin cream (BACTROBAN) 2 % Apply 1 application topically 2 (two) times daily.    . Rosuvastatin Calcium 10 MG CPSP Take 10 mg by mouth daily.     . Tamsulosin HCl (FLOMAX) 0.4 MG CAPS Take 0.4 mg by mouth daily.    Marland Kitchen warfarin (COUMADIN) 2.5 MG tablet Take as instructed by Coumadin Clinic    . metoprolol succinate (TOPROL-XL) 50 MG 24 hr tablet Take 1 tablet (50 mg total) by mouth 2 (two) times daily. Take with or immediately following a meal. 90 tablet 3   No current facility-administered medications for this encounter.    BP 110/76   Pulse 67   Wt 100.6 kg (221 lb 12.8 oz)   SpO2 94%   BMI 27.72 kg/m  General: NAD Neck: No JVD, no thyromegaly or thyroid nodule.  Lungs: Clear to auscultation bilaterally with normal respiratory effort. CV: Nondisplaced PMI.  Heart regular S1/S2 with mechanical S2, no S3/S4, 1/6 SEM RUSB.   No peripheral edema.  No carotid bruit.  Normal pedal pulses.  Abdomen: Soft, nontender, no hepatosplenomegaly, no distention.  Skin: Intact without lesions or rashes.  Neurologic: Alert and oriented x 3.  Psych: Normal affect. Extremities: No clubbing or cyanosis.  HEENT: Normal.   Assessment/Plan: 1. Bicuspid aortic valve disorder: Patient had bicuspid aortic valve and developed endocarditis.  He had AVR initially in 1997 then developed a pseudoaneurysm of the aortic root and repeat AVR with a bioprosthetic valve in 2009.  2019 developed severe AI, had failed TAVR procedure in 6/19 followed by Bentall procedure with mechanical aortic valve and coronary reimplantation in 9/19.  The valve appears stable on  today's echo, EF 50-55%.   - Needs antibiotic prophylaxis with dental procedures.  - Continue warfarin, goal INR 2-3.   - Decrease ASA dose to 81 mg daily.  - He can decrease metoprolol to 50 mg bid.  Will gradually titrate down.  - Check CBC/BMET. - Sternal surgical site looks benign.  2. Hyperlipidemia: Check lipids today, he is on Crestor.     3. CVAs: Peri-operatively with failed TAVR.  He seems to have recovered back to his baseline.   Followup in 2 wks.    Loralie Champagne 10/05/2017 10:54 PM

## 2017-10-09 ENCOUNTER — Ambulatory Visit (INDEPENDENT_AMBULATORY_CARE_PROVIDER_SITE_OTHER): Payer: PRIVATE HEALTH INSURANCE | Admitting: *Deleted

## 2017-10-09 DIAGNOSIS — Z7901 Long term (current) use of anticoagulants: Secondary | ICD-10-CM | POA: Diagnosis not present

## 2017-10-09 DIAGNOSIS — I359 Nonrheumatic aortic valve disorder, unspecified: Secondary | ICD-10-CM

## 2017-10-09 LAB — POCT INR: INR: 2.2 (ref 2.0–3.0)

## 2017-10-09 NOTE — Patient Instructions (Signed)
Description   Continue taking 2 tablets (5mg ) daily.  Recheck in 1 week. Call Coumadin Clinic [phone] with new medications, bleeding or questions.

## 2017-10-16 ENCOUNTER — Ambulatory Visit (INDEPENDENT_AMBULATORY_CARE_PROVIDER_SITE_OTHER): Payer: PRIVATE HEALTH INSURANCE | Admitting: *Deleted

## 2017-10-16 DIAGNOSIS — Z7901 Long term (current) use of anticoagulants: Secondary | ICD-10-CM

## 2017-10-16 DIAGNOSIS — I359 Nonrheumatic aortic valve disorder, unspecified: Secondary | ICD-10-CM | POA: Diagnosis not present

## 2017-10-16 LAB — POCT INR: INR: 1.7 — AB (ref 2.0–3.0)

## 2017-10-16 NOTE — Patient Instructions (Signed)
Description   Today take 3 tablets, then start taking 2 tablets daily except 3 tablets on Thursdays.  Recheck in 11 days. Call Coumadin Clinic [phone] with new medications, bleeding or questions.

## 2017-10-21 ENCOUNTER — Other Ambulatory Visit: Payer: Self-pay

## 2017-10-21 ENCOUNTER — Encounter (HOSPITAL_COMMUNITY): Payer: Self-pay | Admitting: Cardiology

## 2017-10-21 ENCOUNTER — Ambulatory Visit (HOSPITAL_COMMUNITY)
Admission: RE | Admit: 2017-10-21 | Discharge: 2017-10-21 | Disposition: A | Discharge status: Home / Self Care | Payer: PRIVATE HEALTH INSURANCE | Source: Ambulatory Visit | Attending: Cardiology | Admitting: Cardiology

## 2017-10-21 VITALS — BP 108/68 | HR 77 | Wt 221.4 lb

## 2017-10-21 DIAGNOSIS — Z8249 Family history of ischemic heart disease and other diseases of the circulatory system: Secondary | ICD-10-CM | POA: Diagnosis not present

## 2017-10-21 DIAGNOSIS — Z79899 Other long term (current) drug therapy: Secondary | ICD-10-CM | POA: Diagnosis not present

## 2017-10-21 DIAGNOSIS — Z8673 Personal history of transient ischemic attack (TIA), and cerebral infarction without residual deficits: Secondary | ICD-10-CM | POA: Insufficient documentation

## 2017-10-21 DIAGNOSIS — E785 Hyperlipidemia, unspecified: Secondary | ICD-10-CM | POA: Insufficient documentation

## 2017-10-21 DIAGNOSIS — Z7901 Long term (current) use of anticoagulants: Secondary | ICD-10-CM | POA: Diagnosis not present

## 2017-10-21 DIAGNOSIS — Z7982 Long term (current) use of aspirin: Secondary | ICD-10-CM | POA: Insufficient documentation

## 2017-10-21 DIAGNOSIS — Z953 Presence of xenogenic heart valve: Secondary | ICD-10-CM | POA: Insufficient documentation

## 2017-10-21 DIAGNOSIS — Z09 Encounter for follow-up examination after completed treatment for conditions other than malignant neoplasm: Secondary | ICD-10-CM | POA: Diagnosis present

## 2017-10-21 DIAGNOSIS — I359 Nonrheumatic aortic valve disorder, unspecified: Secondary | ICD-10-CM | POA: Diagnosis not present

## 2017-10-21 DIAGNOSIS — Q231 Congenital insufficiency of aortic valve: Secondary | ICD-10-CM | POA: Diagnosis present

## 2017-10-21 DIAGNOSIS — M791 Myalgia, unspecified site: Secondary | ICD-10-CM | POA: Diagnosis not present

## 2017-10-21 MED ORDER — METOPROLOL SUCCINATE ER 25 MG PO TB24: 25.0000 mg | ORAL_TABLET | Freq: Two times a day (BID) | ORAL | 3 refills | Status: DC

## 2017-10-21 NOTE — Patient Instructions (Signed)
DECREASE Metoprolol to 25 mg one tab twice a day  Your physician recommends that you schedule a follow-up appointment in: 3 months with Dr Aundra Dubin

## 2017-10-22 ENCOUNTER — Other Ambulatory Visit: Payer: Self-pay | Admitting: Cardiothoracic Surgery

## 2017-10-22 DIAGNOSIS — T8149XA Infection following a procedure, other surgical site, initial encounter: Secondary | ICD-10-CM

## 2017-10-22 NOTE — Progress Notes (Signed)
Patient ID: Corey Rose, male   DOB: [birth year], 42 y.o.   MRN: [id number] PCP: Dr. Joylene Draft Cardiology: Dr. Aundra Dubin  42 y.o.with history of bicuspid valve disorder s/p AVR x 3 presents for followup of aortic valve disorder.  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  He had a homograft aortic valve placed at that time.  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  He had repeat AVR with a Freestyle Medtronic bioprosthetic valve in 2009. Both initial AVRs were at [facility] (Dr. Clementeen Graham).    In 2019, he was noted to have a louder diastolic murmur and significant aortic insufficiency was noted.  He was seen again at [facility], and decision was made to undergo TAVR.  Initial TAVR valve was undersized and migrated.  A wall stent was used to crush this valve against the wall of the aortic root, leading to severe AI.  The patient developed flash pulmonary edema and was put on ECMO for several days. After wall stent was placed, a Medtronic Corevalve was deployed in the aortic position.  The patient was taken off ECMO.  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 He underwent extensive rehab at the [facility] in [place] and seems to have recovered nearly fully from strokes.   Repeat TEE in 8/19 showed severe peri-valvular aortic insufficiency around the Medtronic Corevalve prosthesis with preserved EF.   Patient was then seen at the [facility].  He had a 3rd sternotomy with removal of the 2 TAVR valves and wall stent, Bentall procedure with mechanical aortic valve and reimplantation of the coronaries.    Echo in 9/19 showed EF 50-55% (low normal to mildly decreased), mildly dilated RV with mildly decreased systolic function, normal mechanical aortic valve.   He returns today for followup of mechanical AVR.  He is doing very well, has been walking 3.5 miles several times a week and has added in short  distance jogging.  No exertional dyspnea, no lightheadedness.  No chest pain, now not taking any pain medications.  Weight stable off Lasix.   Labs (12/13): K 4.5, creatinine 0.9, HDL 41, LDL 194, TSH normal Labs (3/16): LDL 111, HDL 44  Labs (9/19): K 4.2, creatinine 0.94, hgb 10.2  PMH: 1. Hyperlipidemia: Myalgias with simvastatin. 2. 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 AVRs were both at [facility], Dr. Clementeen Graham.  Echo (1/14) with EF 55-60%, bioprosthetic aortic valve with mean gradient 5 mmHg.  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  Echo (3/16) with EF 55-60%, bioprosthetic aortic valve with mean gradient 4 mmHg and trivial AI, normal RV size and systolic function.  - Echo (4/18): EF 60-65%, well-seated bioprosthetic aortic valve with no stenosis, mild regurgitation.  - MRA chest (1/17) with 4.3 cm ascending aorta.  - Development of severe AI in 2019 => he had a complicated procedure in 6/19 with failed initial TAVR with migration of valve followed by wall stent followed Medtronic Corevalve.   - TAVR valve noted to have severe peri-valvular regurgitation on 8/19 TEE.  - In 9/19, He had a 3rd sternotomy with removal of the 2 TAVR valves and wall stent, Bentall procedure with mechanical aortic valve and reimplantation of the coronaries.  -  Echo (9/19) showed EF 50-55% (low normal to mildly decreased), mildly dilated RV with mildly decreased systolic function, normal mechanical aortic valve.  3. H/o prostatitis 4. H/o bladder stones 5. CVAs: Peri-operatively with failed TAVR in 6/19.   SH: Married, 2 children, lives in [place].  Owns [employer]   FH: Father with atrial fibrillation, grandfather with "heart disease."   ROS: All systems reviewed and negative except as per HPI.    Current Outpatient Medications  Medication Sig Dispense Refill  . aspirin 81 MG tablet Take 1 tablet (81 mg total) by mouth daily. 30 tablet 3  . escitalopram (LEXAPRO) 10 MG tablet Take 10 mg by mouth daily.    . Lactobacillus Rhamnosus, GG, (CULTURELLE PO) Take 1 tablet by mouth daily.    . Melatonin 3 MG TABS Take 6 mg by mouth at bedtime.    . metoprolol succinate (TOPROL-XL) 25 MG 24 hr tablet Take 1 tablet (25 mg total) by mouth 2 (two) times daily. Take with or immediately following a meal. 60 tablet 3  . mirtazapine (REMERON) 7.5 MG tablet Take 7.5 mg by mouth at bedtime.    . mupirocin cream (BACTROBAN) 2 % Apply 1 application topically 2 (two) times daily.    . Rosuvastatin Calcium 10 MG CPSP Take 10 mg by mouth daily.     . Tamsulosin HCl (FLOMAX) 0.4 MG CAPS Take 0.4 mg by mouth daily.    Marland Kitchen warfarin (COUMADIN) 2.5 MG tablet Take as instructed by Coumadin Clinic     No current facility-administered medications for this encounter.    BP 108/68   Pulse 77   Wt 100.4 kg (221 lb 6 oz)   SpO2 100%   BMI 27.67 kg/m  General: NAD Neck: No JVD, no thyromegaly or thyroid nodule.  Lungs: Clear to auscultation bilaterally with normal respiratory effort. CV: Nondisplaced PMI.  Heart regular S1/S2 with mechanical S2, no S3/S4, 1/6 SEM RUSB.  No peripheral edema.  No carotid bruit.  Normal pedal pulses.  Abdomen: Soft, nontender, no hepatosplenomegaly, no distention.  Skin: Intact without lesions or rashes.  Neurologic: Alert and oriented x 3.  Psych: Normal affect. Extremities: No clubbing or cyanosis.  HEENT: Normal.   Assessment/Plan: 1. Bicuspid aortic valve disorder: Patient had bicuspid aortic valve and developed endocarditis.  He had AVR initially in 1997 then developed a pseudoaneurysm of the aortic root and repeat AVR with a bioprosthetic valve in 2009.  2019 developed severe AI, had failed TAVR procedure in 6/19 followed by Bentall procedure with mechanical aortic valve  and coronary reimplantation in 9/19.  The valve appeared stable on 9/19 echo, EF 50-55%.   - Needs antibiotic prophylaxis with dental procedures.  - Continue warfarin, goal INR 2-3.   - Continue ASA 81 daily.   - Decrease Toprol XL to 25 mg bid. With mildly decreased LV systolic function, I will continue him on Toprol XL at least until repeat echo in 3/19.  - Sternal surgical site looks benign.  2. Hyperlipidemia: Check lipids at next appt.    3. CVAs: Peri-operatively with failed TAVR.  He seems to have recovered back to his baseline.   Followup in 3 months.   Loralie Champagne 10/22/2017

## 2017-10-23 ENCOUNTER — Ambulatory Visit: Payer: PRIVATE HEALTH INSURANCE | Admitting: Cardiothoracic Surgery

## 2017-10-24 ENCOUNTER — Ambulatory Visit
Admission: RE | Admit: 2017-10-24 | Discharge: 2017-10-24 | Disposition: A | Discharge status: Home / Self Care | Payer: PRIVATE HEALTH INSURANCE | Source: Ambulatory Visit | Attending: Cardiothoracic Surgery | Admitting: Cardiothoracic Surgery

## 2017-10-24 ENCOUNTER — Encounter: Payer: Self-pay | Admitting: Cardiothoracic Surgery

## 2017-10-24 ENCOUNTER — Other Ambulatory Visit: Payer: Self-pay

## 2017-10-24 ENCOUNTER — Ambulatory Visit (INDEPENDENT_AMBULATORY_CARE_PROVIDER_SITE_OTHER): Payer: PRIVATE HEALTH INSURANCE | Admitting: Cardiothoracic Surgery

## 2017-10-24 VITALS — BP 108/78 | HR 82 | Resp 18 | Ht 75.0 in | Wt 220.0 lb

## 2017-10-24 DIAGNOSIS — Z9889 Other specified postprocedural states: Secondary | ICD-10-CM | POA: Diagnosis not present

## 2017-10-24 DIAGNOSIS — T8149XA Infection following a procedure, other surgical site, initial encounter: Secondary | ICD-10-CM

## 2017-10-24 NOTE — Progress Notes (Signed)
PCP is Crist Infante, MD Referring Provider is Crist Infante, MD  Chief Complaint  Patient presents with  . Follow-up    after AVR with [facility] with chest xray  Patient examined, images of chest x-ray performed today and most recent echocardiogram performed late September 2019 personally reviewed.  HPI: 42 year old very nice young man presents for surgical follow-up after third time sternotomy for AVR-root replacement with an Onyx mechanical AVR and a 28 mm Dacron straight graft with reimplantation of coronary arteries in mid September 2019 at the [facility] clinic.  Previously he had undergone homograft root replacement for endocarditis over 20 years ago followed by porcine mini root replacement in 2009 followed by TAVR which ultimately resulted in large perivalvular leak.  He has done very well since the last operation.  He has maintained sinus rhythm.  He is taking Coumadin with an INR our goal of 2.0.  He has had no clinical bleeding problems.  He is walking over 2 miles a day and starting to do some light jogging.  He has been using 10 pound dumbbells for light upper body exercise.  He has declined a formal cardiac rehab program at the hospital because of his young age and advanced level of conditioning.  Today's chest x-ray shows clear lung fields, sternal wires intact, normal cardiac silhouette and no pleural effusion.  We discussed his recovery and sternal healing and sternal precautions which she needs to follow until 3 months after surgery or mid December.  He should not lift more than 20 pounds until then.  He can continue with his current aerobic activity and light lifting.  He understands importance of antibiotic prophylaxis before dental work and should not have any dental work until 3 months after his operation if possible.   Past Medical History:  Diagnosis Date  . History of aortic valve replacement 1997, 2009   . Hyperlipidemia   . Prostatitis 2010    Dr Merryl Hacker , [facility]  . Rosacea   . Stroke [facility])     Past Surgical History:  Procedure Laterality Date  . AORTIC VALVE REPLACEMENT  1997    [facility];post Endocarditis   . AORTIC VALVE REPLACEMENT  2009   [facility]; Dr Clementeen Graham  . bladder stones removed  2010   [facility]  . TEE WITHOUT CARDIOVERSION N/A 08/11/2017   Procedure: TRANSESOPHAGEAL ECHOCARDIOGRAM (TEE);  Surgeon: Larey Dresser, MD;  Location: [facility] ENDOSCOPY;  Service: Cardiovascular;  Laterality: N/A;    Family History  Problem Relation Age of Onset  . Hyperlipidemia Mother   . Skin cancer Mother        ? melanoma, basal & squamous cell  . Hyperlipidemia Father   . Atrial fibrillation Father   . Skin cancer Father   . Stroke Neg Hx   . Diabetes Neg Hx   . Heart attack Neg Hx     Social History Social History   Tobacco Use  . Smoking status: Former Smoker    Start date: 01/07/1994    Last attempt to quit: 01/07/1994    Years since quitting: 23.8  . Smokeless tobacco: Never Used  . Tobacco comment: smoked 1990-1996, up to 1 pp week  Substance Use Topics  . Alcohol use: No    Alcohol/week: 0.0 standard drinks    Comment:  quit 2009  . Drug use: No    Current Outpatient Medications  Medication Sig Dispense Refill  . aspirin 81 MG tablet Take 1 tablet (81 mg total) by mouth daily.  30 tablet 3  . escitalopram (LEXAPRO) 10 MG tablet Take 10 mg by mouth daily.    . Lactobacillus Rhamnosus, GG, (CULTURELLE PO) Take 1 tablet by mouth daily.    . Melatonin 3 MG TABS Take 6 mg by mouth at bedtime.    . metoprolol succinate (TOPROL-XL) 25 MG 24 hr tablet Take 1 tablet (25 mg total) by mouth 2 (two) times daily. Take with or immediately following a meal. 60 tablet 3  . mirtazapine (REMERON) 7.5 MG tablet Take 7.5 mg by mouth at bedtime.    . mupirocin cream (BACTROBAN) 2 % Apply 1 application topically 2 (two) times daily.    . Rosuvastatin Calcium 10 MG CPSP Take 10 mg by mouth daily.     . Tamsulosin HCl (FLOMAX) 0.4 MG CAPS Take 0.4 mg by mouth  daily.    Marland Kitchen warfarin (COUMADIN) 2.5 MG tablet Take as instructed by Coumadin Clinic     No current facility-administered medications for this visit.     Allergies  Allergen Reactions  . Zocor [Simvastatin]     Muscle pain    Review of Systems   Appetite and strength have significantly improved.  He still gets somewhat fatigued after late afternoon.  He is working a full schedule for Museum/gallery exhibitions officer.  No significant chest pain No ankle edema No fever No difficulty swallowing No orthopnea  BP 108/78 (BP Location: Left Arm, Patient Position: Sitting, Cuff Size: Normal)   Pulse 82   Resp 18   Ht 6\' 3"  (1.905 m)   Wt 220 lb (99.8 kg)   SpO2 98% Comment: RA  BMI 27.50 kg/m  Physical Exam      Exam    General- alert and comfortable    Neck- no JVD, no cervical adenopathy palpable, no carotid bruit   Lungs- clear without rales, wheezes.  Sternal wound stable healing well.   Cor- regular rate and rhythm, no murmur , gallop.  Sharp aortic valve click from mechanical AVR   Abdomen- soft, non-tender   Extremities - warm, non-tender, minimal edema   Neuro- oriented, appropriate, no focal weakness   Diagnostic Tests: Chest x-ray clear  Impression: Patient is approximately two thirds recovered from his surgery in mid-September.  Should not lift more than 20 pounds until mid December.  His current activity level is very appropriate and he is showing excellent recovery following a complicated operation.  Plan: Return for review of progress and general exam in mid December.   Len Childs, MD [facility] [phone]

## 2017-10-27 ENCOUNTER — Ambulatory Visit (INDEPENDENT_AMBULATORY_CARE_PROVIDER_SITE_OTHER): Payer: PRIVATE HEALTH INSURANCE | Admitting: *Deleted

## 2017-10-27 DIAGNOSIS — I359 Nonrheumatic aortic valve disorder, unspecified: Secondary | ICD-10-CM

## 2017-10-27 DIAGNOSIS — I639 Cerebral infarction, unspecified: Secondary | ICD-10-CM

## 2017-10-27 DIAGNOSIS — Z7901 Long term (current) use of anticoagulants: Secondary | ICD-10-CM | POA: Diagnosis not present

## 2017-10-27 LAB — POCT INR: INR: 1.8 — AB (ref 2.0–3.0)

## 2017-10-27 NOTE — Patient Instructions (Signed)
Description   Today take 3 tablets, then start taking 2 tablets daily except 3 tablets on Mondays and Thursdays.  Recheck in 10 days. Call Coumadin Clinic [phone] with new medications, bleeding or questions.

## 2017-11-07 ENCOUNTER — Ambulatory Visit (INDEPENDENT_AMBULATORY_CARE_PROVIDER_SITE_OTHER): Payer: PRIVATE HEALTH INSURANCE

## 2017-11-07 DIAGNOSIS — Z7901 Long term (current) use of anticoagulants: Secondary | ICD-10-CM | POA: Diagnosis not present

## 2017-11-07 DIAGNOSIS — I639 Cerebral infarction, unspecified: Secondary | ICD-10-CM | POA: Diagnosis not present

## 2017-11-07 DIAGNOSIS — I359 Nonrheumatic aortic valve disorder, unspecified: Secondary | ICD-10-CM | POA: Diagnosis not present

## 2017-11-07 LAB — POCT INR: INR: 1.5 — AB (ref 2.0–3.0)

## 2017-11-07 NOTE — Patient Instructions (Signed)
Description   Today take 3 tablets, then start taking 3 tablets daily except 2 tablets on Tuesdays, Thursdays and Sundays.  Recheck in 10 days. Call Coumadin Clinic [phone] with new medications, bleeding or questions.

## 2017-11-17 ENCOUNTER — Ambulatory Visit (INDEPENDENT_AMBULATORY_CARE_PROVIDER_SITE_OTHER): Payer: PRIVATE HEALTH INSURANCE

## 2017-11-17 DIAGNOSIS — I359 Nonrheumatic aortic valve disorder, unspecified: Secondary | ICD-10-CM | POA: Diagnosis not present

## 2017-11-17 DIAGNOSIS — Z7901 Long term (current) use of anticoagulants: Secondary | ICD-10-CM

## 2017-11-17 LAB — POCT INR: INR: 1.4 — AB (ref 2.0–3.0)

## 2017-11-17 NOTE — Patient Instructions (Signed)
Description   Today take 4 tablets, then start taking 3 tablets daily.  Recheck in 7 days. Call Coumadin Clinic [phone] with new medications, bleeding or questions.

## 2017-11-25 ENCOUNTER — Other Ambulatory Visit: Payer: Self-pay | Admitting: Cardiology

## 2017-11-25 ENCOUNTER — Ambulatory Visit (INDEPENDENT_AMBULATORY_CARE_PROVIDER_SITE_OTHER): Payer: PRIVATE HEALTH INSURANCE | Admitting: *Deleted

## 2017-11-25 DIAGNOSIS — Z7901 Long term (current) use of anticoagulants: Secondary | ICD-10-CM

## 2017-11-25 DIAGNOSIS — I359 Nonrheumatic aortic valve disorder, unspecified: Secondary | ICD-10-CM | POA: Diagnosis not present

## 2017-11-25 LAB — POCT INR: INR: 1.8 — AB (ref 2.0–3.0)

## 2017-11-25 MED ORDER — WARFARIN SODIUM 2.5 MG PO TABS: ORAL_TABLET | ORAL | 0 refills | Status: DC

## 2017-11-25 MED ORDER — WARFARIN SODIUM 7.5 MG PO TABS: ORAL_TABLET | ORAL | 1 refills | Status: DC

## 2017-11-25 NOTE — Patient Instructions (Signed)
Description   Today take 10mg , then start taking 7.5mg  daily except 10mg  on Saturdays.  Recheck in 7 days. Call Coumadin Clinic [phone] with new medications, bleeding or questions.

## 2017-11-26 ENCOUNTER — Other Ambulatory Visit: Payer: Self-pay | Admitting: Cardiology

## 2017-12-03 ENCOUNTER — Ambulatory Visit (INDEPENDENT_AMBULATORY_CARE_PROVIDER_SITE_OTHER): Payer: PRIVATE HEALTH INSURANCE

## 2017-12-03 DIAGNOSIS — Z7901 Long term (current) use of anticoagulants: Secondary | ICD-10-CM

## 2017-12-03 DIAGNOSIS — I359 Nonrheumatic aortic valve disorder, unspecified: Secondary | ICD-10-CM

## 2017-12-03 LAB — POCT INR: INR: 2.4 (ref 2.0–3.0)

## 2017-12-03 NOTE — Patient Instructions (Signed)
Description   Continue on same dosage 7.5mg  daily except 10mg  on Saturdays.  Recheck in 2 weeks. Call Coumadin Clinic [phone] with new medications, bleeding or questions.

## 2017-12-17 ENCOUNTER — Ambulatory Visit (INDEPENDENT_AMBULATORY_CARE_PROVIDER_SITE_OTHER): Payer: PRIVATE HEALTH INSURANCE | Admitting: *Deleted

## 2017-12-17 ENCOUNTER — Encounter: Payer: Self-pay | Admitting: Cardiothoracic Surgery

## 2017-12-17 ENCOUNTER — Other Ambulatory Visit: Payer: Self-pay

## 2017-12-17 ENCOUNTER — Ambulatory Visit (INDEPENDENT_AMBULATORY_CARE_PROVIDER_SITE_OTHER): Payer: PRIVATE HEALTH INSURANCE | Admitting: Cardiothoracic Surgery

## 2017-12-17 VITALS — BP 117/82 | HR 64 | Resp 18 | Ht 75.0 in | Wt 226.4 lb

## 2017-12-17 DIAGNOSIS — Z954 Presence of other heart-valve replacement: Secondary | ICD-10-CM | POA: Diagnosis not present

## 2017-12-17 DIAGNOSIS — Z952 Presence of prosthetic heart valve: Secondary | ICD-10-CM

## 2017-12-17 DIAGNOSIS — Z7901 Long term (current) use of anticoagulants: Secondary | ICD-10-CM | POA: Diagnosis not present

## 2017-12-17 DIAGNOSIS — I359 Nonrheumatic aortic valve disorder, unspecified: Secondary | ICD-10-CM | POA: Diagnosis not present

## 2017-12-17 LAB — POCT INR: INR: 2.3 (ref 2.0–3.0)

## 2017-12-17 NOTE — Patient Instructions (Signed)
Description   Continue on same dosage 7.5mg  daily except 10mg  on Saturdays.  Recheck in 2-3 weeks. Call Coumadin Clinic [phone] with new medications, bleeding or questions.

## 2017-12-17 NOTE — Progress Notes (Signed)
PCP is Crist Infante, MD Referring Provider is Crist Infante, MD  Chief Complaint  Patient presents with  . Follow-up    s/p aortic root repair, AVR    HPI: Patient returns for scheduled follow-up 3 months after third time sternotomy for aortic root replacement with a mechanical valve-conduit at the [facility] clinic He has an Onyx valve in a 28 mm Hemashield graft.  He is doing very well.  The third time sternotomy incision appears to be well-healed.  Chest x-ray on his last visit was completely clear.  Today his INR is 2.2 and he has had no bleeding complications.  He remains in sinus rhythm.  The left groin incision placed last summer when he needed ECMO support at [facility] has completely healed. He is jogging 3 miles and lifting up to 20 pounds.  He denies any clicking or sternal popping sensation.  He does not complain of any surgical pain. No ankle edema, shortness of breath, angina.  Patient does complain of depression and some neurocognitive impairment in his work.  The patient had a stroke in mid summer when he was on ECMO.  He is on antidepressant Lexapro and Remeron.  We talked about his sternal precautions and since he is at 3 months postop he can increase his activity and lifting limits.  Normally there would be no restrictions after 3 months with sternal healing usually complete but after 3 sternotomies I have recommended more conservative approach.  He will hold off on doing pull-ups or push-ups or body weight exercise and should not lift more than 40-50 pounds above his head.  This restriction will be lifted after March.  If the patient continues to have some frustration with neurocognitive impairment following his stroke that persists for 6 months following surgery then we will try to refer him to a therapist. Past Medical History:  Diagnosis Date  . History of aortic valve replacement 1997, 2009   . Hyperlipidemia   . Prostatitis 2010    Dr Merryl Hacker , [facility]  .  Rosacea   . Stroke [facility])     Past Surgical History:  Procedure Laterality Date  . AORTIC VALVE REPLACEMENT  1997    [facility];post Endocarditis   . AORTIC VALVE REPLACEMENT  2009   [facility]; Dr Clementeen Graham  . bladder stones removed  2010   [facility]  . TEE WITHOUT CARDIOVERSION N/A 08/11/2017   Procedure: TRANSESOPHAGEAL ECHOCARDIOGRAM (TEE);  Surgeon: Larey Dresser, MD;  Location: [facility] ENDOSCOPY;  Service: Cardiovascular;  Laterality: N/A;    Family History  Problem Relation Age of Onset  . Hyperlipidemia Mother   . Skin cancer Mother        ? melanoma, basal & squamous cell  . Hyperlipidemia Father   . Atrial fibrillation Father   . Skin cancer Father   . Stroke Neg Hx   . Diabetes Neg Hx   . Heart attack Neg Hx     Social History Social History   Tobacco Use  . Smoking status: Former Smoker    Start date: 01/07/1994    Last attempt to quit: 01/07/1994    Years since quitting: 23.9  . Smokeless tobacco: Never Used  . Tobacco comment: smoked 1990-1996, up to 1 pp week  Substance Use Topics  . Alcohol use: No    Alcohol/week: 0.0 standard drinks    Comment:  quit 2009  . Drug use: No    Current Outpatient Medications  Medication Sig Dispense Refill  . aspirin 81 MG  tablet Take 1 tablet (81 mg total) by mouth daily. 30 tablet 3  . escitalopram (LEXAPRO) 10 MG tablet Take 20 mg by mouth daily.     . Lactobacillus Rhamnosus, GG, (CULTURELLE PO) Take 1 tablet by mouth daily.    . Melatonin 3 MG TABS Take 6 mg by mouth at bedtime.    . metoprolol succinate (TOPROL-XL) 25 MG 24 hr tablet Take 1 tablet (25 mg total) by mouth 2 (two) times daily. Take with or immediately following a meal. 60 tablet 3  . mirtazapine (REMERON) 7.5 MG tablet Take 7.5 mg by mouth at bedtime.    . Rosuvastatin Calcium 10 MG CPSP Take 10 mg by mouth daily.     . Tamsulosin HCl (FLOMAX) 0.4 MG CAPS Take 0.4 mg by mouth daily.    Marland Kitchen warfarin (COUMADIN) 2.5 MG tablet Take as instructed by Coumadin Clinic 15 tablet 0   . warfarin (COUMADIN) 7.5 MG tablet Take as directed by Coumadin Clinic 40 tablet 1   No current facility-administered medications for this visit.     Allergies  Allergen Reactions  . Zocor [Simvastatin]     Muscle pain    Review of Systems  Good appetite Good exercise tolerance Working full-time No fever No dental complaints Plans on taking amoxicillin antibiotic prophylaxis prior to dental cleaning or other procedures  BP 117/82 (BP Location: Right Arm, Patient Position: Sitting, Cuff Size: Large)   Pulse 64   Resp 18   Ht 6\' 3"  (1.905 m)   Wt 226 lb 6.4 oz (102.7 kg)   SpO2 99% Comment: RA  BMI 28.30 kg/m  Physical Exam      Exam    General- alert and comfortable    Neck- no JVD, no cervical adenopathy palpable, no carotid bruit   Lungs- clear without rales, wheezes.  Sternal incision well-healed.   Cor- regular rate and rhythm, no murmur , gallop.  Normal AVR click   Abdomen- soft, non-tender   Extremities - warm, non-tender, minimal edema   Neuro- oriented, appropriate, no focal weakness   Diagnostic Tests: None  Impression: Excellent early recovery 3 months postop third time sternotomy and repair redo root replacement with a mechanical valve-conduit  Plan: Patient will increase his activity as noted above.  He will return in approximately 3 months for assessment of progress.  I will plan on getting a CTA of his aortic repair mid 2020  Len Childs, MD [facility] [phone]

## 2017-12-22 ENCOUNTER — Ambulatory Visit (HOSPITAL_COMMUNITY)
Admission: RE | Admit: 2017-12-22 | Discharge: 2017-12-22 | Disposition: A | Discharge status: Home / Self Care | Payer: PRIVATE HEALTH INSURANCE | Source: Ambulatory Visit | Attending: Cardiology | Admitting: Cardiology

## 2017-12-22 ENCOUNTER — Other Ambulatory Visit (HOSPITAL_COMMUNITY): Payer: Self-pay | Admitting: *Deleted

## 2017-12-22 DIAGNOSIS — I493 Ventricular premature depolarization: Secondary | ICD-10-CM

## 2017-12-28 ENCOUNTER — Other Ambulatory Visit: Payer: Self-pay | Admitting: Cardiology

## 2018-01-05 ENCOUNTER — Ambulatory Visit (INDEPENDENT_AMBULATORY_CARE_PROVIDER_SITE_OTHER): Payer: PRIVATE HEALTH INSURANCE | Admitting: *Deleted

## 2018-01-05 DIAGNOSIS — Z7901 Long term (current) use of anticoagulants: Secondary | ICD-10-CM | POA: Diagnosis not present

## 2018-01-05 DIAGNOSIS — I359 Nonrheumatic aortic valve disorder, unspecified: Secondary | ICD-10-CM | POA: Diagnosis not present

## 2018-01-05 LAB — POCT INR: INR: 2.3 (ref 2.0–3.0)

## 2018-01-05 NOTE — Patient Instructions (Signed)
Description   Continue on same dosage 7.5mg  daily except 10mg  on Saturdays.  Recheck in 4 weeks. Call Coumadin Clinic [phone] with new medications, bleeding or questions.

## 2018-01-21 ENCOUNTER — Encounter (HOSPITAL_COMMUNITY): Payer: Self-pay | Admitting: Cardiology

## 2018-01-21 ENCOUNTER — Ambulatory Visit (HOSPITAL_COMMUNITY)
Admission: RE | Admit: 2018-01-21 | Discharge: 2018-01-21 | Disposition: A | Discharge status: Home / Self Care | Payer: PRIVATE HEALTH INSURANCE | Source: Ambulatory Visit | Attending: Cardiology | Admitting: Cardiology

## 2018-01-21 VITALS — BP 128/80 | HR 66 | Wt 227.6 lb

## 2018-01-21 DIAGNOSIS — E785 Hyperlipidemia, unspecified: Secondary | ICD-10-CM | POA: Diagnosis not present

## 2018-01-21 DIAGNOSIS — Z8673 Personal history of transient ischemic attack (TIA), and cerebral infarction without residual deficits: Secondary | ICD-10-CM | POA: Diagnosis not present

## 2018-01-21 DIAGNOSIS — Z7982 Long term (current) use of aspirin: Secondary | ICD-10-CM | POA: Diagnosis not present

## 2018-01-21 DIAGNOSIS — Z7901 Long term (current) use of anticoagulants: Secondary | ICD-10-CM | POA: Diagnosis not present

## 2018-01-21 DIAGNOSIS — I359 Nonrheumatic aortic valve disorder, unspecified: Secondary | ICD-10-CM | POA: Diagnosis present

## 2018-01-21 DIAGNOSIS — I493 Ventricular premature depolarization: Secondary | ICD-10-CM

## 2018-01-21 DIAGNOSIS — Z953 Presence of xenogenic heart valve: Secondary | ICD-10-CM | POA: Diagnosis not present

## 2018-01-21 DIAGNOSIS — Z79899 Other long term (current) drug therapy: Secondary | ICD-10-CM | POA: Insufficient documentation

## 2018-01-21 DIAGNOSIS — Z8249 Family history of ischemic heart disease and other diseases of the circulatory system: Secondary | ICD-10-CM | POA: Diagnosis not present

## 2018-01-21 LAB — CBC
HCT: 44.6 % (ref 39.0–52.0)
Hemoglobin: 14.2 g/dL (ref 13.0–17.0)
MCH: 27.2 pg (ref 26.0–34.0)
MCHC: 31.8 g/dL (ref 30.0–36.0)
MCV: 85.4 fL (ref 80.0–100.0)
Platelets: 227 10*3/uL (ref 150–400)
RBC: 5.22 MIL/uL (ref 4.22–5.81)
RDW: 17.4 % — ABNORMAL HIGH (ref 11.5–15.5)
WBC: 5.5 10*3/uL (ref 4.0–10.5)
nRBC: 0 % (ref 0.0–0.2)

## 2018-01-21 LAB — LIPID PANEL
Cholesterol: 142 mg/dL (ref 0–200)
HDL: 40 mg/dL — ABNORMAL LOW (ref >40)
LDL Cholesterol: 81 mg/dL (ref 0–99)
Total CHOL/HDL Ratio: 3.6 RATIO
Triglycerides: 104 mg/dL (ref <150)
VLDL: 21 mg/dL (ref 0–40)

## 2018-01-21 NOTE — Patient Instructions (Signed)
Labs were done today. We will call you with any ABNORMAL results. No news is good news.   Your physician has requested that you have an echocardiogram. Echocardiography is a painless test that uses sound waves to create images of your heart. It provides your doctor with information about the size and shape of your heart and how well your heart's chambers and valves are working. This procedure takes approximately one hour. There are no restrictions for this procedure.  Your physician recommends that you schedule a follow-up appointment in March, 2020.

## 2018-01-21 NOTE — Progress Notes (Signed)
Patient ID: Corey Rose, male   DOB: [birth year], 43 y.o.   MRN: [id number] PCP: Dr. Joylene Draft Cardiology: Dr. Aundra Dubin  43 y.o.with history of bicuspid valve disorder s/p AVR x 3 presents for followup of aortic valve disorder.  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  He had a homograft aortic valve placed at that time.  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  He had repeat AVR with a Freestyle Medtronic bioprosthetic valve in 2009. Both initial AVRs were at [facility] (Dr. Clementeen Graham).    In 2019, he was noted to have a louder diastolic murmur and significant aortic insufficiency was noted.  He was seen again at [facility], and decision was made to undergo TAVR.  Initial TAVR valve was undersized and migrated.  A wall stent was used to crush this valve against the wall of the aortic root, leading to severe AI.  The patient developed flash pulmonary edema and was put on ECMO for several days. After wall stent was placed, a Medtronic Corevalve was deployed in the aortic position.  The patient was taken off ECMO.  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 He underwent extensive rehab at the [facility] in [place] and seems to have recovered nearly fully from strokes.   Repeat TEE in 8/19 showed severe peri-valvular aortic insufficiency around the Medtronic Corevalve prosthesis with preserved EF.   Patient was then seen at the [facility].  He had a 3rd sternotomy with removal of the 2 TAVR valves and wall stent, Bentall procedure with Onyx mechanical aortic valve/28 mm Hemashield graft and reimplantation of the coronaries.    Echo in 9/19 showed EF 50-55% (low normal to mildly decreased), mildly dilated RV with mildly decreased systolic function, normal mechanical aortic valve.   He returns today for followup of mechanical AVR.  He is doing very well overall.  Able to jog 4 miles at 9.5 min/mile  pace, can run up a hill but goes slow still.  No palpitations.  No chest pain. Back at work full time.    Labs (12/13): K 4.5, creatinine 0.9, HDL 41, LDL 194, TSH normal Labs (3/16): LDL 111, HDL 44  Labs (9/19): K 4.2, creatinine 0.94, hgb 10.2  PMH: 1. Hyperlipidemia: Myalgias with simvastatin. 2. 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 AVRs were both at [name], Dr. Clementeen Graham.  Echo (1/14) with EF 55-60%, bioprosthetic aortic valve with mean gradient 5 mmHg.  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  Echo (3/16) with EF 55-60%, bioprosthetic aortic valve with mean gradient 4 mmHg and trivial AI, normal RV size and systolic function.  - Echo (4/18): EF 60-65%, well-seated bioprosthetic aortic valve with no stenosis, mild regurgitation.  - MRA chest (1/17) with 4.3 cm ascending aorta.  - Development of severe AI in 2019 => he had a complicated procedure in 6/19 with failed initial TAVR with migration of valve followed by wall stent followed Medtronic Corevalve.   - TAVR valve noted to have severe peri-valvular regurgitation on 8/19 TEE.  - In 9/19, He had a 3rd sternotomy with removal of the 2 TAVR valves and wall stent, Bentall procedure with Onyx mechanical aortic valve/28 mm Hemashield graft and reimplantation  of the coronaries.  - Echo (9/19) showed EF 50-55% (low normal to mildly decreased), mildly dilated RV with mildly decreased systolic function, normal mechanical aortic valve.  3. H/o prostatitis 4. H/o bladder stones 5. CVAs: Peri-operatively with failed TAVR in 6/19.  6. PVCs  SH: Married, 2 children, lives in [place].  Owns [employer]   FH: Father with atrial fibrillation, grandfather with "heart disease."   ROS: All systems reviewed and negative except as per HPI.    Current Outpatient Medications  Medication Sig Dispense Refill  . aspirin 81 MG tablet Take 1 tablet (81 mg total) by mouth daily. 30 tablet 3  . escitalopram (LEXAPRO) 10 MG tablet Take 20 mg by mouth daily.     . Melatonin 3 MG TABS Take 6 mg by mouth at bedtime.    . metoprolol succinate (TOPROL-XL) 25 MG 24 hr tablet Take 1 tablet (25 mg total) by mouth 2 (two) times daily. Take with or immediately following a meal. 60 tablet 3  . mirtazapine (REMERON) 7.5 MG tablet Take 7.5 mg by mouth at bedtime.    . Rosuvastatin Calcium 10 MG CPSP Take 10 mg by mouth daily.     . Tamsulosin HCl (FLOMAX) 0.4 MG CAPS Take 0.4 mg by mouth daily.    Marland Kitchen warfarin (COUMADIN) 2.5 MG tablet Take as instructed by Coumadin Clinic 15 tablet 0  . warfarin (COUMADIN) 7.5 MG tablet TAKE BY MOUTH AS DIRECTED BY COUMADIN CLINIC 40 tablet 2   No current facility-administered medications for this encounter.    BP 128/80   Pulse 66   Wt 103.2 kg (227 lb 9.6 oz)   SpO2 99%   BMI 28.45 kg/m  General: NAD Neck: No JVD, no thyromegaly or thyroid nodule.  Lungs: Clear to auscultation bilaterally with normal respiratory effort. CV: Nondisplaced PMI.  Heart regular S1/S2 with mechanical S2, no S3/S4, 1/6 SEM RUSB.  No peripheral edema.  No carotid bruit.  Normal pedal pulses.  Abdomen: Soft, nontender, no hepatosplenomegaly, no distention.  Skin: Intact without lesions or rashes.  Neurologic: Alert and oriented x 3.  Psych: Normal affect. Extremities: No clubbing or cyanosis.  HEENT: Normal.   Assessment/Plan: 1. Bicuspid aortic valve disorder: Patient had bicuspid aortic valve and developed endocarditis.  He had AVR initially in 1997 then developed a pseudoaneurysm of the aortic root and repeat AVR with a bioprosthetic valve in 2009.  2019 developed severe AI, had failed TAVR procedure in 6/19 followed by Bentall procedure with Onyx mechanical aortic valve/28 mm Hemahshield graft and coronary reimplantation in 9/19.   The valve appeared stable on 9/19 echo, EF 50-55%.   - Needs antibiotic prophylaxis with dental procedures.  - Continue warfarin, goal INR 2-3.  CBC today.  - Continue ASA 81 daily.   - Continue Toprol XL 25 mg bid. With mildly decreased LV systolic function, I will continue him on Toprol XL at least until repeat echo in 3/20 (schedule today).  - He was seen by Dr. Prescott Gum, has lifting precautions until 3/20. He will need a CTA to assess ascending aorta surgical site in mid-2020.  2. Hyperlipidemia: Check lipids today.   3. CVAs: Peri-operatively with failed TAVR.  He seems to have recovered back to his baseline.  4. PVCs: No palpitations recently.   Followup in 3 months.   Loralie Champagne 01/21/2018

## 2018-01-22 ENCOUNTER — Telehealth (HOSPITAL_COMMUNITY): Payer: Self-pay

## 2018-01-22 NOTE — Telephone Encounter (Signed)
Pt called no answer voice mail left for pt to call back 

## 2018-02-02 ENCOUNTER — Other Ambulatory Visit (HOSPITAL_COMMUNITY): Payer: Self-pay | Admitting: Cardiology

## 2018-02-04 ENCOUNTER — Ambulatory Visit (INDEPENDENT_AMBULATORY_CARE_PROVIDER_SITE_OTHER): Payer: PRIVATE HEALTH INSURANCE

## 2018-02-04 DIAGNOSIS — I359 Nonrheumatic aortic valve disorder, unspecified: Secondary | ICD-10-CM | POA: Diagnosis not present

## 2018-02-04 DIAGNOSIS — Z7901 Long term (current) use of anticoagulants: Secondary | ICD-10-CM

## 2018-02-04 LAB — POCT INR: INR: 1.9 — AB (ref 2.0–3.0)

## 2018-02-04 NOTE — Patient Instructions (Signed)
Description   Take 10mg  today, then resume same dosage 7.5mg  daily except 10mg  on Saturdays.  Recheck in 4 weeks. Call Coumadin Clinic [phone] with new medications, bleeding or questions.

## 2018-03-13 ENCOUNTER — Ambulatory Visit (INDEPENDENT_AMBULATORY_CARE_PROVIDER_SITE_OTHER): Payer: PRIVATE HEALTH INSURANCE

## 2018-03-13 DIAGNOSIS — Z7901 Long term (current) use of anticoagulants: Secondary | ICD-10-CM | POA: Diagnosis not present

## 2018-03-13 DIAGNOSIS — I359 Nonrheumatic aortic valve disorder, unspecified: Secondary | ICD-10-CM | POA: Diagnosis not present

## 2018-03-13 LAB — POCT INR: INR: 2.2 (ref 2.0–3.0)

## 2018-03-13 NOTE — Patient Instructions (Signed)
Description   Continue on same dosage 7.5mg  daily except 10mg  on Saturdays.  Recheck in 4 weeks. Call Coumadin Clinic [phone] with new medications, bleeding or questions.

## 2018-04-01 ENCOUNTER — Encounter (HOSPITAL_COMMUNITY): Payer: PRIVATE HEALTH INSURANCE | Admitting: Cardiology

## 2018-04-01 ENCOUNTER — Ambulatory Visit: Payer: PRIVATE HEALTH INSURANCE | Admitting: Cardiothoracic Surgery

## 2018-04-01 ENCOUNTER — Ambulatory Visit (HOSPITAL_COMMUNITY): Payer: PRIVATE HEALTH INSURANCE

## 2018-04-09 ENCOUNTER — Telehealth: Payer: Self-pay

## 2018-04-09 NOTE — Telephone Encounter (Signed)
lmom for prescreen and drive thru 

## 2018-04-10 NOTE — Telephone Encounter (Signed)
Called pt and advised him that in the long run we do not want him to use his dad's meter as this will likely cause billing issues with test strips/# of times checking INRs etc. He should be ok to check 1 time given current COVID19 outbreak and safety concerns. He will check his INR on Monday and call the result to [facility] office. Will then schedule f/u appt back in clinic at that time.

## 2018-04-10 NOTE — Telephone Encounter (Signed)
Called the pt to prescreen and make aware of mondays appt but the pt wanted to cancel so I cancelled appt. The pt stated that his father has a coumadin machine to check inr at home and wishes to use it on himself... will route to pharmd for further instruction

## 2018-04-13 NOTE — Telephone Encounter (Signed)
Pt called clinic - there was an issue with using his dad's INR machine. Scheduled INR check for tomorrow AM and COVID19 screened pt.  1. Do you currently have a fever? No 2. Have you recently travelled on a cruise, internationally, or to [place], [place], [place], [place], [place], or [place], [place] [employer]) ? No 3. Have you been in contact with someone that is currently pending confirmation of Covid19 testing or has been confirmed to have the [place] virus? No 4. Are you currently experiencing fatigue or cough? No  Pt. Advised that we are restricting visitors at this time and anyone present in the vehicle should meet the above criteria as well. Advised that visit will be at curbside for finger stick ONLY and will receive call with instructions. Pt also advised to please bring own pen for signature of arrival document.

## 2018-04-14 ENCOUNTER — Other Ambulatory Visit: Payer: Self-pay

## 2018-04-14 ENCOUNTER — Ambulatory Visit (INDEPENDENT_AMBULATORY_CARE_PROVIDER_SITE_OTHER): Payer: PRIVATE HEALTH INSURANCE | Admitting: Pharmacist

## 2018-04-14 DIAGNOSIS — Z7901 Long term (current) use of anticoagulants: Secondary | ICD-10-CM | POA: Diagnosis not present

## 2018-04-14 DIAGNOSIS — I359 Nonrheumatic aortic valve disorder, unspecified: Secondary | ICD-10-CM | POA: Diagnosis not present

## 2018-04-14 LAB — POCT INR: INR: 2.7 (ref 2.0–3.0)

## 2018-04-14 MED ORDER — WARFARIN SODIUM 7.5 MG PO TABS: ORAL_TABLET | ORAL | 3 refills | Status: DC

## 2018-05-07 ENCOUNTER — Other Ambulatory Visit: Payer: Self-pay | Admitting: Pharmacist

## 2018-05-07 MED ORDER — WARFARIN SODIUM 7.5 MG PO TABS: ORAL_TABLET | ORAL | 1 refills | Status: DC

## 2018-05-11 ENCOUNTER — Telehealth: Payer: Self-pay

## 2018-05-11 NOTE — Telephone Encounter (Signed)
Called to screen for appt but the pt wished to reschedule

## 2018-05-15 ENCOUNTER — Telehealth: Payer: Self-pay

## 2018-05-15 ENCOUNTER — Other Ambulatory Visit (HOSPITAL_COMMUNITY): Payer: Self-pay | Admitting: Cardiology

## 2018-05-15 NOTE — Telephone Encounter (Signed)
lmom for prescreen  

## 2018-05-18 ENCOUNTER — Other Ambulatory Visit: Payer: Self-pay

## 2018-05-18 ENCOUNTER — Ambulatory Visit (INDEPENDENT_AMBULATORY_CARE_PROVIDER_SITE_OTHER): Payer: PRIVATE HEALTH INSURANCE | Admitting: *Deleted

## 2018-05-18 DIAGNOSIS — Z7901 Long term (current) use of anticoagulants: Secondary | ICD-10-CM

## 2018-05-18 DIAGNOSIS — I359 Nonrheumatic aortic valve disorder, unspecified: Secondary | ICD-10-CM

## 2018-05-18 LAB — POCT INR: INR: 1.8 — AB (ref 2.0–3.0)

## 2018-05-18 NOTE — Patient Instructions (Signed)
Description   Spoke with pt and instructed pt to take 1.5 tablets today then continue on same dosage 1 tablet daily except 1.5 tablets on Saturdays.  Recheck in 3 weeks. Call Coumadin Clinic [phone] with new medications, bleeding or questions.

## 2018-06-05 ENCOUNTER — Telehealth: Payer: Self-pay

## 2018-06-05 NOTE — Telephone Encounter (Signed)

## 2018-06-08 ENCOUNTER — Other Ambulatory Visit: Payer: Self-pay

## 2018-06-08 ENCOUNTER — Ambulatory Visit (INDEPENDENT_AMBULATORY_CARE_PROVIDER_SITE_OTHER): Payer: PRIVATE HEALTH INSURANCE | Admitting: *Deleted

## 2018-06-08 DIAGNOSIS — I359 Nonrheumatic aortic valve disorder, unspecified: Secondary | ICD-10-CM

## 2018-06-08 DIAGNOSIS — Z7901 Long term (current) use of anticoagulants: Secondary | ICD-10-CM | POA: Diagnosis not present

## 2018-06-08 LAB — POCT INR: INR: 2.7 (ref 2.0–3.0)

## 2018-06-08 NOTE — Patient Instructions (Signed)
Description   Continue on same dosage 1 tablet daily except 1.5 tablets on Saturdays.  Recheck in 4 weeks. Call Coumadin Clinic [phone] with new medications, bleeding or questions.

## 2018-06-09 ENCOUNTER — Other Ambulatory Visit: Payer: Self-pay

## 2018-06-09 ENCOUNTER — Telehealth (HOSPITAL_COMMUNITY): Payer: Self-pay

## 2018-06-09 ENCOUNTER — Ambulatory Visit (HOSPITAL_COMMUNITY)
Admission: RE | Admit: 2018-06-09 | Discharge: 2018-06-09 | Disposition: A | Discharge status: Home / Self Care | Payer: PRIVATE HEALTH INSURANCE | Source: Ambulatory Visit | Attending: Cardiology | Admitting: Cardiology

## 2018-06-09 DIAGNOSIS — E785 Hyperlipidemia, unspecified: Secondary | ICD-10-CM | POA: Diagnosis not present

## 2018-06-09 DIAGNOSIS — I359 Nonrheumatic aortic valve disorder, unspecified: Secondary | ICD-10-CM

## 2018-06-09 DIAGNOSIS — Z952 Presence of prosthetic heart valve: Secondary | ICD-10-CM | POA: Diagnosis not present

## 2018-06-09 DIAGNOSIS — Z87891 Personal history of nicotine dependence: Secondary | ICD-10-CM | POA: Diagnosis not present

## 2018-06-09 MED ORDER — VALSARTAN 40 MG PO TABS: 40.0000 mg | ORAL_TABLET | Freq: Every day | ORAL | 6 refills | Status: DC

## 2018-06-09 NOTE — Telephone Encounter (Signed)
-----   Message from Larey Dresser, MD sent at 06/09/2018  4:22 PM EDT ----- Start valsartan 40 mg daily.  BMET 2 wks.  Call if any problems with getting the med [employer])

## 2018-06-09 NOTE — Telephone Encounter (Signed)
Spoke with patient regarding starting new medication.  Pt will return to clinic in 2 weeks for lab work. Pt advised to call office if any challenges with obtaining medicine. Pt states understanding of all

## 2018-06-23 ENCOUNTER — Other Ambulatory Visit: Payer: Self-pay

## 2018-06-23 ENCOUNTER — Ambulatory Visit (HOSPITAL_COMMUNITY)
Admission: RE | Admit: 2018-06-23 | Discharge: 2018-06-23 | Disposition: A | Discharge status: Home / Self Care | Payer: PRIVATE HEALTH INSURANCE | Source: Ambulatory Visit | Attending: Internal Medicine | Admitting: Internal Medicine

## 2018-06-23 DIAGNOSIS — I359 Nonrheumatic aortic valve disorder, unspecified: Secondary | ICD-10-CM | POA: Diagnosis present

## 2018-06-23 LAB — BASIC METABOLIC PANEL
Anion gap: 6 (ref 5–15)
BUN: 12 mg/dL (ref 6–20)
CO2: 28 mmol/L (ref 22–32)
Calcium: 9.2 mg/dL (ref 8.9–10.3)
Chloride: 106 mmol/L (ref 98–111)
Creatinine, Ser: 1.18 mg/dL (ref 0.61–1.24)
GFR calc Af Amer: >60 mL/min (ref >60)
GFR calc non Af Amer: >60 mL/min (ref >60)
Glucose, Bld: 87 mg/dL (ref 70–99)
Potassium: 4.1 mmol/L (ref 3.5–5.1)
Sodium: 140 mmol/L (ref 135–145)

## 2018-06-30 ENCOUNTER — Telehealth: Payer: Self-pay

## 2018-06-30 NOTE — Telephone Encounter (Signed)
lmom for prescreen  

## 2018-07-02 ENCOUNTER — Telehealth: Payer: Self-pay | Admitting: Pharmacist

## 2018-07-02 ENCOUNTER — Other Ambulatory Visit: Payer: Self-pay

## 2018-07-02 ENCOUNTER — Ambulatory Visit (HOSPITAL_COMMUNITY)
Admission: RE | Admit: 2018-07-02 | Discharge: 2018-07-02 | Disposition: A | Discharge status: Home / Self Care | Payer: PRIVATE HEALTH INSURANCE | Source: Ambulatory Visit | Attending: Cardiology | Admitting: Cardiology

## 2018-07-02 ENCOUNTER — Encounter (HOSPITAL_COMMUNITY): Payer: Self-pay | Admitting: Cardiology

## 2018-07-02 VITALS — BP 100/72 | HR 59 | Wt 226.2 lb

## 2018-07-02 DIAGNOSIS — Z7901 Long term (current) use of anticoagulants: Secondary | ICD-10-CM | POA: Diagnosis not present

## 2018-07-02 DIAGNOSIS — M791 Myalgia, unspecified site: Secondary | ICD-10-CM | POA: Insufficient documentation

## 2018-07-02 DIAGNOSIS — Z8673 Personal history of transient ischemic attack (TIA), and cerebral infarction without residual deficits: Secondary | ICD-10-CM | POA: Insufficient documentation

## 2018-07-02 DIAGNOSIS — I359 Nonrheumatic aortic valve disorder, unspecified: Secondary | ICD-10-CM | POA: Diagnosis not present

## 2018-07-02 DIAGNOSIS — Z953 Presence of xenogenic heart valve: Secondary | ICD-10-CM | POA: Insufficient documentation

## 2018-07-02 DIAGNOSIS — Z8249 Family history of ischemic heart disease and other diseases of the circulatory system: Secondary | ICD-10-CM | POA: Insufficient documentation

## 2018-07-02 DIAGNOSIS — E785 Hyperlipidemia, unspecified: Secondary | ICD-10-CM | POA: Diagnosis not present

## 2018-07-02 DIAGNOSIS — Z7982 Long term (current) use of aspirin: Secondary | ICD-10-CM | POA: Diagnosis not present

## 2018-07-02 DIAGNOSIS — Z79899 Other long term (current) drug therapy: Secondary | ICD-10-CM | POA: Diagnosis not present

## 2018-07-02 DIAGNOSIS — I493 Ventricular premature depolarization: Secondary | ICD-10-CM | POA: Diagnosis not present

## 2018-07-02 DIAGNOSIS — Q231 Congenital insufficiency of aortic valve: Secondary | ICD-10-CM | POA: Insufficient documentation

## 2018-07-02 NOTE — Patient Instructions (Signed)
No medication changes at this time  Follow-up in 6 months

## 2018-07-02 NOTE — Progress Notes (Signed)
Patient declined AVS. Pt advised that he will receive a phone call to schedule an appointment in 6 months. Patient verbalized understanding.

## 2018-07-02 NOTE — Telephone Encounter (Signed)
INR goal changed to 2-2.5 per Dr. Aundra Dubin

## 2018-07-02 NOTE — Progress Notes (Signed)
Spoke with Pharmacist Melissa at coumadin clinic, INR goal changed to 2-2.5 per Dr Aundra Dubin.

## 2018-07-02 NOTE — Progress Notes (Signed)
Patient ID: Corey Rose, male   DOB: February 07, 1975, 43 y.o.   MRN: [id number] PCP: Dr. Joylene Draft Cardiology: Dr. Aundra Dubin  43 y.o.with history of bicuspid valve disorder s/p AVR x 3 presents for followup of aortic valve disorder.  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  He had a homograft aortic valve placed at that time.  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  He had repeat AVR with a Freestyle Medtronic bioprosthetic valve in 2009. Both initial AVRs were at [facility] (Dr. Clementeen Graham).    In 2019, he was noted to have a louder diastolic murmur and significant aortic insufficiency was noted.  He was seen again at [facility], and decision was made to undergo TAVR.  Initial TAVR valve was undersized and migrated.  A wall stent was used to crush this valve against the wall of the aortic root, leading to severe AI.  The patient developed flash pulmonary edema and was put on ECMO for several days. After wall stent was placed, a Medtronic Corevalve was deployed in the aortic position.  The patient was taken off ECMO.  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 He underwent extensive rehab at the [facility] in [place] and seems to have recovered nearly fully from strokes.   Repeat TEE in 8/19 showed severe peri-valvular aortic insufficiency around the Medtronic Corevalve prosthesis with preserved EF.   Patient was then seen at the [facility].  He had a 3rd sternotomy with removal of the 2 TAVR valves and wall stent, Bentall procedure with On-X mechanical aortic valve/28 mm Hemashield graft and reimplantation of the coronaries.    Echo in 9/19 showed EF 50-55% (low normal to mildly decreased), mildly dilated RV with mildly decreased systolic function, normal mechanical aortic valve.  Repeat echo in 6/20 showed stable EF 50-55%, mild LV dilation, mild RV dilation with normal systolic function, normal  mechanical aortic valve.   He returns today for followup of mechanical AVR.  He is doing very well overall.  Exercising 4-5 days a week with excellent exercise capacity.  No BRPBR/melena.  No chest pain.  No palpitations.  No exertional dyspnea.   Labs (12/13): K 4.5, creatinine 0.9, HDL 41, LDL 194, TSH normal Labs (3/16): LDL 111, HDL 44  Labs (9/19): K 4.2, creatinine 0.94, hgb 10.2 Labs (1/20): LDL 81 Labs (6/20): K 4.1, creatinine 1.18  PMH: 1. Hyperlipidemia: Myalgias with simvastatin. 2. 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 AVRs were both at [facility], Dr. Clementeen Graham.  Echo (1/14) with EF 55-60%, bioprosthetic aortic valve with mean gradient 5 mmHg.  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  Echo (3/16) with EF 55-60%, bioprosthetic aortic valve with mean gradient 4 mmHg and trivial AI, normal RV size and systolic function.  - Echo (4/18): EF 60-65%, well-seated bioprosthetic aortic valve with no stenosis, mild regurgitation.  - MRA chest (1/17) with 4.3 cm ascending aorta.  - Development of severe AI in 2019 => he had a complicated procedure in 6/19 with failed initial TAVR with migration of valve followed by wall stent followed Medtronic Corevalve.   - TAVR valve noted to have severe peri-valvular regurgitation on 8/19 TEE.  - In 9/19, He had  a 3rd sternotomy with removal of the 2 TAVR valves and wall stent, Bentall procedure with On-X mechanical aortic valve/28 mm Hemashield graft and reimplantation of the coronaries.  - Echo (9/19) showed EF 50-55% (low normal to mildly decreased), mildly dilated RV with mildly decreased systolic function, normal mechanical aortic valve.  - Echo (6/20) showed stable EF 50-55%, mild LV dilation, mild RV dilation with normal systolic  function, normal mechanical aortic valve (On-X).   3. H/o prostatitis 4. H/o bladder stones 5. CVAs: Peri-operatively with failed TAVR in 6/19.  6. PVCs  SH: Married, 2 children, lives in [place].  Owns [employer]   FH: Father with atrial fibrillation, grandfather with "heart disease."   ROS: All systems reviewed and negative except as per HPI.   Current Outpatient Medications  Medication Sig Dispense Refill  . aspirin 81 MG tablet Take 1 tablet (81 mg total) by mouth daily. 30 tablet 3  . escitalopram (LEXAPRO) 10 MG tablet Take 20 mg by mouth daily.     . Melatonin 3 MG TABS Take 6 mg by mouth at bedtime.    . metoprolol succinate (TOPROL-XL) 25 MG 24 hr tablet TAKE 1 TABLET BY MOUTH TWICE DAILY( WITH MEALS OR IMMEDIATELY FOLLOWING ONE) 180 tablet 3  . mirtazapine (REMERON) 7.5 MG tablet Take 7.5 mg by mouth at bedtime.    . Rosuvastatin Calcium 10 MG CPSP Take 10 mg by mouth daily.     . Tamsulosin HCl (FLOMAX) 0.4 MG CAPS Take 0.4 mg by mouth daily.    . valsartan (DIOVAN) 40 MG tablet Take 1 tablet (40 mg total) by mouth daily. 30 tablet 6  . warfarin (COUMADIN) 7.5 MG tablet TAKE 1 to 1.5 tablets daily BY MOUTH AS DIRECTED BY COUMADIN CLINIC 135 tablet 1   No current facility-administered medications for this encounter.    BP 100/72   Pulse (!) 59   Wt 102.6 kg (226 lb 3.2 oz)   SpO2 98%   BMI 28.27 kg/m  General: NAD Neck: No JVD, no thyromegaly or thyroid nodule.  Lungs: Clear to auscultation bilaterally with normal respiratory effort. CV: Nondisplaced PMI.  Heart regular S1/S2 with mechanical S1, no S3/S4, 1/6 SEM RUSB.  No peripheral edema.  No carotid bruit.  Normal pedal pulses.  Abdomen: Soft, nontender, no hepatosplenomegaly, no distention.  Skin: Intact without lesions or rashes.  Neurologic: Alert and oriented x 3.  Psych: Normal affect. Extremities: No clubbing or cyanosis.  HEENT: Normal.   Assessment/Plan: 1. Bicuspid aortic valve disorder: Patient had  bicuspid aortic valve and developed endocarditis.  He had AVR initially in 1997 then developed a pseudoaneurysm of the aortic root and repeat AVR with a bioprosthetic valve in 2009.  2019 developed severe AI, had failed TAVR procedure in 6/19 followed by Bentall procedure with On-X mechanical aortic valve/28 mm Hemashield graft and coronary reimplantation in 9/19.  The valve appeared stable on 9/19 and 6/20 echoes, EF 50-55%.   - Needs antibiotic prophylaxis with dental procedures.  - Patient has an On-X mechanical valve and is now out > 3 months from surgery. Based on data from PROACT trial, he may be able to decrease INR goal to 1.5-2 long-term.  I will go ahead and contact the coumadin clinic to decrease his INR goal to 2-2.5, but will talk with cardiac surgery colleagues regarding lowering goal to 1.5-2 range.  - Continue ASA 81 daily.   - Continue Toprol XL 25 mg bid and valsartan 40 mg daily with mildly  dilated LV with mildly decreased systolic function (EF 72% persistently).  - He will need a CTA to assess ascending aorta surgical site in mid-2020, will obtain at next appointment.  2. Hyperlipidemia: Check lipids today.   3. CVAs: Peri-operatively with failed TAVR.  He seems to have recovered back to his baseline.  4. PVCs: No palpitations recently.   Followup in 6 months.   Loralie Champagne 07/02/2018

## 2018-07-07 ENCOUNTER — Ambulatory Visit (INDEPENDENT_AMBULATORY_CARE_PROVIDER_SITE_OTHER): Payer: PRIVATE HEALTH INSURANCE | Admitting: *Deleted

## 2018-07-07 ENCOUNTER — Other Ambulatory Visit: Payer: Self-pay

## 2018-07-07 DIAGNOSIS — Z7901 Long term (current) use of anticoagulants: Secondary | ICD-10-CM

## 2018-07-07 DIAGNOSIS — I359 Nonrheumatic aortic valve disorder, unspecified: Secondary | ICD-10-CM

## 2018-07-07 LAB — POCT INR: INR: 1.7 — AB (ref 2.0–3.0)

## 2018-07-07 NOTE — Patient Instructions (Signed)
Description   Today take 1.5 tablets then continue on same dosage 1 tablet daily except 1.5 tablets on Saturdays.  Recheck in 3 weeks. Call Coumadin Clinic [phone] with new medications, bleeding or questions.

## 2018-07-27 ENCOUNTER — Telehealth: Payer: Self-pay

## 2018-07-27 NOTE — Telephone Encounter (Signed)
1. COVID-19 Pre-Screening Questions:  . In the past 7 to 10 days have you had a cough,  shortness of breath, headache, congestion, fever (100 or greater) body aches, chills, sore throat, or sudden loss of taste or sense of smell? no . Have you been around anyone with known Covid 19.  no . Have you been around anyone who is awaiting Covid 19 test results in the past 7 to 10 days?yes . Have you been around anyone who has been exposed to Covid 19, or has mentioned symptoms of Covid 19 within the past 7 to 10 days?  no    2. Pt advised of visitor restrictions (no visitors allowed except if needed to conduct the visit). Also advised to arrive at appointment time and wear a mask.  Cancelled the pts appt and awaiting a a callback regarding the results

## 2018-08-05 ENCOUNTER — Other Ambulatory Visit (HOSPITAL_COMMUNITY): Payer: Self-pay | Admitting: Cardiology

## 2018-09-04 ENCOUNTER — Ambulatory Visit (INDEPENDENT_AMBULATORY_CARE_PROVIDER_SITE_OTHER): Payer: PRIVATE HEALTH INSURANCE | Admitting: *Deleted

## 2018-09-04 ENCOUNTER — Other Ambulatory Visit: Payer: Self-pay

## 2018-09-04 DIAGNOSIS — I359 Nonrheumatic aortic valve disorder, unspecified: Secondary | ICD-10-CM | POA: Diagnosis not present

## 2018-09-04 DIAGNOSIS — Z7901 Long term (current) use of anticoagulants: Secondary | ICD-10-CM | POA: Diagnosis not present

## 2018-09-04 LAB — POCT INR: INR: 2.3 (ref 2.0–3.0)

## 2018-09-04 NOTE — Patient Instructions (Signed)
Description   Continue on same dosage 1 tablet daily except 1.5 tablets on Saturdays.  Recheck in 6 weeks. Call Coumadin Clinic [phone] with new medications, bleeding or questions.

## 2018-09-21 ENCOUNTER — Other Ambulatory Visit: Payer: Self-pay | Admitting: *Deleted

## 2018-09-21 MED ORDER — WARFARIN SODIUM 7.5 MG PO TABS: ORAL_TABLET | ORAL | 0 refills | Status: DC

## 2018-09-29 ENCOUNTER — Other Ambulatory Visit (HOSPITAL_COMMUNITY): Payer: Self-pay | Admitting: Cardiology

## 2018-10-14 ENCOUNTER — Other Ambulatory Visit: Payer: Self-pay

## 2018-10-14 ENCOUNTER — Ambulatory Visit (INDEPENDENT_AMBULATORY_CARE_PROVIDER_SITE_OTHER): Payer: PRIVATE HEALTH INSURANCE | Admitting: *Deleted

## 2018-10-14 DIAGNOSIS — Z7901 Long term (current) use of anticoagulants: Secondary | ICD-10-CM | POA: Diagnosis not present

## 2018-10-14 DIAGNOSIS — I359 Nonrheumatic aortic valve disorder, unspecified: Secondary | ICD-10-CM

## 2018-10-14 LAB — POCT INR: INR: 1.5 — AB (ref 2.0–3.0)

## 2018-10-14 NOTE — Patient Instructions (Signed)
Description    Take and extra 1/2 tablet today, then continue on same dosage 1 tablet daily except 1.5 tablets on Saturdays.  Recheck in 2 weeks. Call Coumadin Clinic [phone] with new medications, bleeding or questions.

## 2018-10-30 ENCOUNTER — Other Ambulatory Visit: Payer: Self-pay

## 2018-10-30 ENCOUNTER — Ambulatory Visit (INDEPENDENT_AMBULATORY_CARE_PROVIDER_SITE_OTHER): Payer: PRIVATE HEALTH INSURANCE | Admitting: *Deleted

## 2018-10-30 DIAGNOSIS — Z7901 Long term (current) use of anticoagulants: Secondary | ICD-10-CM | POA: Diagnosis not present

## 2018-10-30 DIAGNOSIS — I359 Nonrheumatic aortic valve disorder, unspecified: Secondary | ICD-10-CM

## 2018-10-30 LAB — POCT INR: INR: 1.7 — AB (ref 2.0–3.0)

## 2018-10-30 NOTE — Patient Instructions (Signed)
Description    Take and extra 1/2 tablet today, then continue on same dosage 1 tablet daily except 1.5 tablets on Saturdays.  Recheck in 2 weeks. Call Coumadin Clinic [phone] with new medications, bleeding or questions.

## 2018-11-18 ENCOUNTER — Ambulatory Visit (INDEPENDENT_AMBULATORY_CARE_PROVIDER_SITE_OTHER): Payer: PRIVATE HEALTH INSURANCE | Admitting: *Deleted

## 2018-11-18 ENCOUNTER — Other Ambulatory Visit: Payer: Self-pay

## 2018-11-18 DIAGNOSIS — Z7901 Long term (current) use of anticoagulants: Secondary | ICD-10-CM

## 2018-11-18 DIAGNOSIS — Z5181 Encounter for therapeutic drug level monitoring: Secondary | ICD-10-CM | POA: Diagnosis not present

## 2018-11-18 LAB — POCT INR: INR: 2 (ref 2.0–3.0)

## 2018-11-18 NOTE — Patient Instructions (Signed)
Description   Continue on same dosage 1 tablet daily except 1.5 tablets on Saturdays.  Recheck in 3 weeks. Call Coumadin Clinic [phone] with new medications, bleeding or questions.

## 2018-12-07 ENCOUNTER — Other Ambulatory Visit: Payer: Self-pay

## 2018-12-07 DIAGNOSIS — Z20822 Contact with and (suspected) exposure to covid-19: Secondary | ICD-10-CM

## 2018-12-08 ENCOUNTER — Telehealth: Payer: Self-pay | Admitting: *Deleted

## 2018-12-08 LAB — NOVEL CORONAVIRUS, NAA: SARS-CoV-2, NAA: NOT DETECTED

## 2018-12-08 NOTE — Telephone Encounter (Signed)
Patient called to cancel appt for tomorrow as he is pending COVID 19 testing. He reports runny nose and cold symptoms. Advised to call back once resulted and that if COVID 19 positive he will have to let us know once he is symptom free with use of medications for multiple days and we would advise him on rescheduling; pt verbalized understanding. Appt for tomorrow was canceled at this time.

## 2018-12-29 ENCOUNTER — Other Ambulatory Visit: Payer: Self-pay

## 2018-12-29 ENCOUNTER — Ambulatory Visit (INDEPENDENT_AMBULATORY_CARE_PROVIDER_SITE_OTHER): Payer: PRIVATE HEALTH INSURANCE | Admitting: *Deleted

## 2018-12-29 DIAGNOSIS — I359 Nonrheumatic aortic valve disorder, unspecified: Secondary | ICD-10-CM | POA: Diagnosis not present

## 2018-12-29 DIAGNOSIS — Z7901 Long term (current) use of anticoagulants: Secondary | ICD-10-CM | POA: Diagnosis not present

## 2018-12-29 LAB — POCT INR: INR: 2.6 (ref 2.0–3.0)

## 2018-12-29 NOTE — Patient Instructions (Signed)
Description   Today take 1/2 tablet then continue on same dosage 1 tablet daily except 1.5 tablets on Saturdays.  Recheck in 3 weeks. Call Coumadin Clinic [phone] with new medications, bleeding or questions.

## 2019-01-26 ENCOUNTER — Other Ambulatory Visit: Payer: Self-pay

## 2019-01-26 ENCOUNTER — Ambulatory Visit (INDEPENDENT_AMBULATORY_CARE_PROVIDER_SITE_OTHER): Payer: PRIVATE HEALTH INSURANCE | Admitting: Pharmacist

## 2019-01-26 DIAGNOSIS — Z7901 Long term (current) use of anticoagulants: Secondary | ICD-10-CM | POA: Diagnosis not present

## 2019-01-26 DIAGNOSIS — I359 Nonrheumatic aortic valve disorder, unspecified: Secondary | ICD-10-CM | POA: Diagnosis not present

## 2019-01-26 LAB — POCT INR: INR: 1.6 — AB (ref 2.0–3.0)

## 2019-01-26 NOTE — Patient Instructions (Signed)
Description   Today take 1.5 tablets today, then start taking 1 tablet daily except 1.5 tablets on Wednesdays and Saturdays.  Recheck in 2-3 weeks. Call Coumadin Clinic [phone] with new medications, bleeding or questions.

## 2019-02-08 ENCOUNTER — Other Ambulatory Visit (HOSPITAL_COMMUNITY): Payer: Self-pay

## 2019-02-08 MED ORDER — VALSARTAN 40 MG PO TABS: 40.0000 mg | ORAL_TABLET | Freq: Every day | ORAL | 6 refills | Status: DC

## 2019-02-12 ENCOUNTER — Other Ambulatory Visit: Payer: Self-pay

## 2019-02-12 ENCOUNTER — Ambulatory Visit (INDEPENDENT_AMBULATORY_CARE_PROVIDER_SITE_OTHER): Payer: PRIVATE HEALTH INSURANCE | Admitting: *Deleted

## 2019-02-12 DIAGNOSIS — Z7901 Long term (current) use of anticoagulants: Secondary | ICD-10-CM | POA: Diagnosis not present

## 2019-02-12 DIAGNOSIS — I359 Nonrheumatic aortic valve disorder, unspecified: Secondary | ICD-10-CM | POA: Diagnosis not present

## 2019-02-12 LAB — POCT INR: INR: 1.5 — AB (ref 2.0–3.0)

## 2019-02-12 NOTE — Patient Instructions (Signed)
Description   Today take 1.5 tablets today and tomorrow, then start taking 1.5 tablets daily except 1 tablet on Mondays, Wednesdays and Fridays. Recheck in 2 weeks. Call Coumadin Clinic [phone] with new medications, bleeding or questions.

## 2019-02-24 ENCOUNTER — Ambulatory Visit (INDEPENDENT_AMBULATORY_CARE_PROVIDER_SITE_OTHER): Payer: PRIVATE HEALTH INSURANCE | Admitting: Pharmacist

## 2019-02-24 ENCOUNTER — Other Ambulatory Visit: Payer: Self-pay

## 2019-02-24 DIAGNOSIS — Z7901 Long term (current) use of anticoagulants: Secondary | ICD-10-CM

## 2019-02-24 DIAGNOSIS — I359 Nonrheumatic aortic valve disorder, unspecified: Secondary | ICD-10-CM | POA: Diagnosis not present

## 2019-02-24 LAB — POCT INR: INR: 2 (ref 2.0–3.0)

## 2019-02-24 NOTE — Patient Instructions (Signed)
Continue taking 1.5 tablets daily except 1 tablet on Mondays, Wednesdays and Fridays. Recheck in 3 weeks. Call Coumadin Clinic [phone] with new medications, bleeding or questions.

## 2019-03-17 ENCOUNTER — Other Ambulatory Visit: Payer: Self-pay

## 2019-03-17 ENCOUNTER — Ambulatory Visit (INDEPENDENT_AMBULATORY_CARE_PROVIDER_SITE_OTHER): Payer: PRIVATE HEALTH INSURANCE | Admitting: *Deleted

## 2019-03-17 DIAGNOSIS — Z7901 Long term (current) use of anticoagulants: Secondary | ICD-10-CM

## 2019-03-17 DIAGNOSIS — I359 Nonrheumatic aortic valve disorder, unspecified: Secondary | ICD-10-CM

## 2019-03-17 LAB — POCT INR: INR: 2 (ref 2.0–3.0)

## 2019-03-17 NOTE — Patient Instructions (Signed)
Description   Continue taking 1.5 tablets daily except 1 tablet on Mondays, Wednesdays and Fridays. Recheck in 4 weeks. Call Coumadin Clinic [phone] with new medications, bleeding or questions.

## 2019-04-15 ENCOUNTER — Ambulatory Visit (INDEPENDENT_AMBULATORY_CARE_PROVIDER_SITE_OTHER): Payer: PRIVATE HEALTH INSURANCE | Admitting: *Deleted

## 2019-04-15 ENCOUNTER — Other Ambulatory Visit: Payer: Self-pay

## 2019-04-15 DIAGNOSIS — Z5181 Encounter for therapeutic drug level monitoring: Secondary | ICD-10-CM

## 2019-04-15 DIAGNOSIS — Z7901 Long term (current) use of anticoagulants: Secondary | ICD-10-CM | POA: Diagnosis not present

## 2019-04-15 DIAGNOSIS — I359 Nonrheumatic aortic valve disorder, unspecified: Secondary | ICD-10-CM | POA: Diagnosis not present

## 2019-04-15 LAB — POCT INR: INR: 1.9 — AB (ref 2.0–3.0)

## 2019-04-15 MED ORDER — WARFARIN SODIUM 10 MG PO TABS: 10.0000 mg | ORAL_TABLET | Freq: Every day | ORAL | 1 refills | Status: DC

## 2019-04-15 NOTE — Patient Instructions (Signed)
Description   Take 11.25 mg today and then start using then 10 mg tablets taking 1 tablet daily (10mg ). Recheck INR in 3 weeks.  Call Coumadin Clinic [phone] with new medications, bleeding or questions.

## 2019-05-06 ENCOUNTER — Other Ambulatory Visit: Payer: Self-pay

## 2019-05-06 ENCOUNTER — Ambulatory Visit (INDEPENDENT_AMBULATORY_CARE_PROVIDER_SITE_OTHER): Payer: PRIVATE HEALTH INSURANCE | Admitting: *Deleted

## 2019-05-06 DIAGNOSIS — I359 Nonrheumatic aortic valve disorder, unspecified: Secondary | ICD-10-CM

## 2019-05-06 DIAGNOSIS — Z7901 Long term (current) use of anticoagulants: Secondary | ICD-10-CM

## 2019-05-06 LAB — POCT INR: INR: 2.1 (ref 2.0–3.0)

## 2019-05-06 NOTE — Patient Instructions (Signed)
Description   Continue taking 1 tablet daily (10mg ). Recheck INR in 4 weeks.  Call Coumadin Clinic [phone] with new medications, bleeding or questions.

## 2019-06-02 ENCOUNTER — Ambulatory Visit (INDEPENDENT_AMBULATORY_CARE_PROVIDER_SITE_OTHER): Payer: PRIVATE HEALTH INSURANCE | Admitting: Pharmacist

## 2019-06-02 ENCOUNTER — Other Ambulatory Visit: Payer: Self-pay

## 2019-06-02 DIAGNOSIS — Z7901 Long term (current) use of anticoagulants: Secondary | ICD-10-CM

## 2019-06-02 DIAGNOSIS — I359 Nonrheumatic aortic valve disorder, unspecified: Secondary | ICD-10-CM

## 2019-06-02 LAB — POCT INR: INR: 2 (ref 2.0–3.0)

## 2019-06-02 NOTE — Patient Instructions (Addendum)
Continue taking 1 tablet daily (10mg ). Recheck INR in 6 weeks.  Call Coumadin Clinic [phone] with new medications, bleeding or questions.

## 2019-06-21 ENCOUNTER — Other Ambulatory Visit: Payer: Self-pay | Admitting: *Deleted

## 2019-06-21 MED ORDER — WARFARIN SODIUM 10 MG PO TABS: 10.0000 mg | ORAL_TABLET | Freq: Every day | ORAL | 2 refills | Status: DC

## 2019-07-15 ENCOUNTER — Other Ambulatory Visit: Payer: Self-pay

## 2019-07-15 ENCOUNTER — Ambulatory Visit (INDEPENDENT_AMBULATORY_CARE_PROVIDER_SITE_OTHER): Payer: PRIVATE HEALTH INSURANCE | Admitting: *Deleted

## 2019-07-15 DIAGNOSIS — I359 Nonrheumatic aortic valve disorder, unspecified: Secondary | ICD-10-CM | POA: Diagnosis not present

## 2019-07-15 DIAGNOSIS — Z7901 Long term (current) use of anticoagulants: Secondary | ICD-10-CM | POA: Diagnosis not present

## 2019-07-15 LAB — POCT INR: INR: 3.1 — AB (ref 2.0–3.0)

## 2019-07-15 NOTE — Patient Instructions (Signed)
Description   Do not take any Warfarin today then continue taking 1 tablet daily (10mg ). Recheck INR in 4 weeks.  Call Coumadin Clinic [phone] with new medications, bleeding or questions.

## 2019-08-11 ENCOUNTER — Other Ambulatory Visit: Payer: Self-pay

## 2019-08-11 ENCOUNTER — Ambulatory Visit (INDEPENDENT_AMBULATORY_CARE_PROVIDER_SITE_OTHER): Payer: PRIVATE HEALTH INSURANCE

## 2019-08-11 DIAGNOSIS — I359 Nonrheumatic aortic valve disorder, unspecified: Secondary | ICD-10-CM

## 2019-08-11 DIAGNOSIS — Z7901 Long term (current) use of anticoagulants: Secondary | ICD-10-CM | POA: Diagnosis not present

## 2019-08-11 LAB — POCT INR: INR: 2.5 (ref 2.0–3.0)

## 2019-08-11 NOTE — Patient Instructions (Signed)
Description   Continue on same dosage 1 tablet daily (10mg ). Recheck INR in 4 weeks.  Call Coumadin Clinic [phone] with new medications, bleeding or questions.

## 2019-08-13 LAB — IFOBT (OCCULT BLOOD): IFOBT: NEGATIVE

## 2019-08-16 ENCOUNTER — Telehealth: Payer: Self-pay | Admitting: Pharmacist

## 2019-08-16 NOTE — Telephone Encounter (Signed)
Pt called stating he will be out of town on a hiking trip for 3 weeks and will not have access to any greens. He usually eats 1-2 salads a day with kale or spinach and his INR has been well controlled with this diet. He purchased a Super K supplement, however this contains ~2063mcg of Vitamin K.   Advised pt to look for OTC Vitamin K 139mcg tablets as 1 tablet would be close to the vitamin K content in 1 cup of raw spinach. He verbalized understanding and will keep INR follow up 9/1 when he's back in town.

## 2019-09-06 ENCOUNTER — Encounter: Payer: Self-pay | Admitting: *Deleted

## 2019-09-06 ENCOUNTER — Encounter: Payer: Self-pay | Admitting: Cardiothoracic Surgery

## 2019-09-06 NOTE — Progress Notes (Unsigned)
error 

## 2019-09-08 ENCOUNTER — Other Ambulatory Visit: Payer: Self-pay

## 2019-09-08 ENCOUNTER — Ambulatory Visit (INDEPENDENT_AMBULATORY_CARE_PROVIDER_SITE_OTHER): Payer: PRIVATE HEALTH INSURANCE | Admitting: *Deleted

## 2019-09-08 DIAGNOSIS — Z5181 Encounter for therapeutic drug level monitoring: Secondary | ICD-10-CM | POA: Diagnosis not present

## 2019-09-08 DIAGNOSIS — I359 Nonrheumatic aortic valve disorder, unspecified: Secondary | ICD-10-CM | POA: Diagnosis not present

## 2019-09-08 DIAGNOSIS — Z7901 Long term (current) use of anticoagulants: Secondary | ICD-10-CM | POA: Diagnosis not present

## 2019-09-08 LAB — POCT INR: INR: 2.2 (ref 2.0–3.0)

## 2019-09-08 NOTE — Patient Instructions (Signed)
Description   Continue on same dosage 1 tablet daily (10mg ). Recheck INR in 5 weeks.  Call Coumadin Clinic [phone] with new medications, bleeding or questions.

## 2019-09-17 ENCOUNTER — Other Ambulatory Visit (HOSPITAL_COMMUNITY): Payer: Self-pay | Admitting: Cardiology

## 2019-09-17 MED ORDER — VALSARTAN 40 MG PO TABS: 40.0000 mg | ORAL_TABLET | Freq: Every day | ORAL | 6 refills | Status: DC

## 2019-10-13 ENCOUNTER — Other Ambulatory Visit: Payer: Self-pay

## 2019-10-13 ENCOUNTER — Ambulatory Visit (INDEPENDENT_AMBULATORY_CARE_PROVIDER_SITE_OTHER): Payer: PRIVATE HEALTH INSURANCE | Admitting: *Deleted

## 2019-10-13 DIAGNOSIS — I359 Nonrheumatic aortic valve disorder, unspecified: Secondary | ICD-10-CM

## 2019-10-13 DIAGNOSIS — Z7901 Long term (current) use of anticoagulants: Secondary | ICD-10-CM | POA: Diagnosis not present

## 2019-10-13 LAB — POCT INR: INR: 3 (ref 2.0–3.0)

## 2019-10-13 NOTE — Patient Instructions (Signed)
Description   Do not take any Warfarin today then continue on same dosage 1 tablet daily (10mg ). Recheck INR in 4 weeks. Call Coumadin Clinic [phone] with new medications, bleeding or questions.

## 2019-10-26 ENCOUNTER — Other Ambulatory Visit (HOSPITAL_COMMUNITY): Payer: Self-pay | Admitting: Cardiology

## 2019-10-27 ENCOUNTER — Telehealth (HOSPITAL_COMMUNITY): Payer: Self-pay | Admitting: *Deleted

## 2019-10-27 MED ORDER — METOPROLOL SUCCINATE ER 25 MG PO TB24: ORAL_TABLET | ORAL | 3 refills | Status: DC

## 2019-10-27 MED ORDER — WARFARIN SODIUM 10 MG PO TABS: 10.0000 mg | ORAL_TABLET | Freq: Every day | ORAL | 2 refills | Status: DC

## 2019-10-27 NOTE — Telephone Encounter (Signed)
-----   Message from Larey Dresser, MD sent at 10/27/2019 10:37 AM EDT ----- Refill warfarin and beta blocker to [place]

## 2019-11-08 ENCOUNTER — Encounter: Payer: Self-pay | Admitting: Neurology

## 2019-11-08 ENCOUNTER — Other Ambulatory Visit: Payer: Self-pay | Admitting: Emergency Medicine

## 2019-11-08 ENCOUNTER — Ambulatory Visit (INDEPENDENT_AMBULATORY_CARE_PROVIDER_SITE_OTHER): Payer: PRIVATE HEALTH INSURANCE | Admitting: Neurology

## 2019-11-08 ENCOUNTER — Other Ambulatory Visit: Payer: Self-pay

## 2019-11-08 ENCOUNTER — Telehealth: Payer: Self-pay | Admitting: Neurology

## 2019-11-08 VITALS — BP 97/64 | HR 54 | Ht 75.0 in | Wt 219.8 lb

## 2019-11-08 DIAGNOSIS — I6990 Unspecified sequelae of unspecified cerebrovascular disease: Secondary | ICD-10-CM | POA: Diagnosis not present

## 2019-11-08 DIAGNOSIS — G3184 Mild cognitive impairment, so stated: Secondary | ICD-10-CM

## 2019-11-08 DIAGNOSIS — R413 Other amnesia: Secondary | ICD-10-CM

## 2019-11-08 DIAGNOSIS — Z952 Presence of prosthetic heart valve: Secondary | ICD-10-CM

## 2019-11-08 MED ORDER — CEREFOLIN NAC 6-2-600 MG PO TABS: 1.0000 | ORAL_TABLET | Freq: Every morning | ORAL | 3 refills | Status: DC

## 2019-11-08 NOTE — Patient Instructions (Signed)
I had a long discussion with the patient regarding his remote perioperative strokes and residual memory loss and short-term memory difficulties.  I recommend checking memory panel labs, EEG, MRI scan of the brain, lipid profile hemoglobin A1c and carotid Dopplers.  Continue warfarin for stroke prevention with INR goal between 2-3.  I recommend he discuss with his cardiologist whether he needs additional with aspirin 81 mg daily as it may increase bleeding risk.  I encouraged him to increase participation in cognitively challenging activities like solving crossword puzzles, playing bridge and sodoku.  We also discussed memory compensation strategies.  I recommend he start taking Cerefolin NAC 1 tablet daily for memory enhancement.  He will return for follow-up in the future in 6 weeks or call earlier if necessary. Memory Compensation Strategies  1. Use "WARM" strategy.  W= write it down  A= associate it  R= repeat it  M= make a mental note  2.   You can keep a Social worker.  Use a 3-ring notebook with sections for the following: calendar, important names and phone numbers,  medications, doctors' names/phone numbers, lists/reminders, and a section to journal what you did  each day.   3.    Use a calendar to write appointments down.  4.    Write yourself a schedule for the day.  This can be placed on the calendar or in a separate section of the Memory Notebook.  Keeping a  regular schedule can help memory.  5.    Use medication organizer with sections for each day or morning/evening pills.  You may need help loading it  6.    Keep a basket, or pegboard by the door.  Place items that you need to take out with you in the basket or on the pegboard.  You may also want to  include a message board for reminders.  7.    Use sticky notes.  Place sticky notes with reminders in a place where the task is performed.  For example: " turn off the  stove" placed by the stove, "lock the door" placed on the  door at eye level, " take your medications" on  the bathroom mirror or by the place where you normally take your medications.  8.    Use alarms/timers.  Use while cooking to remind yourself to check on food or as a reminder to take your medicine, or as a  reminder to make a call, or as a reminder to perform another task, etc.

## 2019-11-08 NOTE — Progress Notes (Signed)
[facility] [address] [place]. [place] [address] [phone]       OFFICE CONSULT NOTE  Corey Rose Date of Birth:  11-26-1975 Medical Record Number:  [id number]   Referring MD: Crist Infante  Reason for Referral: Memory loss and strokes HPI: Corey Rose is a pleasant 44 year old Caucasian male seen today for initial office consultation visit.  History is obtained from the patient and review of electronic medical records and have reviewed pertinent available imaging films in PACS.  He is a pleasant 44 year old male with complicated past cardiac history of bicuspid aortic valve with multiple surgeries. 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 AVRs were both at [facility].Development of severe AI in 2019 => he had a complicated procedure in 6/19 with failed initial TAVR with migration of valve followed by wall stent followed Medtronic Corevalve. CVAs: Peri-operatively with failed TAVR in 6/19.  TAVR valve noted to have severe peri-valvular regurgitation on 8/19 TEE.  - In 9/19, He had a 3rd sternotomy with removal of the 2 TAVR valves and wall stent, Bentall procedure with On-X mechanical aortic valve/28 mm Hemashield graft and reimplantation of the coronaries Patient states that he had significant left hemiparesis following his strokes in June 2019 and required prolonged period of rehabilitation and in fact went to [facility] in [place] where he was there for several months.  He is made good recovery and has regained most of his strength on the left side.  He does feel that fine motor skills are still diminished in the left hand and leg gets tired easily.  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  He does feel that his has poor short-term memory and working memory is quite  poor.  He has trouble remembering names.  Is not participating in any significant memory compensation strategies.  Memory difficulties are unchanged and they are not getting worse every day.  Is not tried any medications for his memory.  He has not had any recent neurovascular work-up or lipid profile A1c checked.  He has not had any lab work to look for reversible causes of memory loss either.  He denies any recurrent stroke or TIA symptoms.  He is living at home and is independent in his no current neurological complaints except memory difficulties ROS:   14 system review of systems is positive for memory difficulties, short-term memory loss, fatigability, tiredness all other systems negative  PMH:  Past Medical History:  Diagnosis Date  . History of aortic valve replacement 1997, 2009   . Hyperlipidemia   . Prostatitis 2010    Dr Merryl Hacker , [facility]  . Rosacea   . Stroke [facility])     Social History:  Social History   Socioeconomic History  . Marital status: Married    Spouse name: Not on file  . Number of children: Not on file  . Years of education: Not on file  . Highest education level: Not on file  Occupational History  . Occupation: Full time  Tobacco Use  . Smoking status: Former Smoker    Start date: 01/07/1994    Quit date: 01/07/1994    Years since quitting: 25.8  . Smokeless tobacco: Never Used  . Tobacco comment: smoked 1990-1996, up to 1 pp week  Substance and Sexual Activity  . Alcohol  use: No    Alcohol/week: 0.0 standard drinks    Comment:  quit 2009  . Drug use: No  . Sexual activity: Not on file  Other Topics Concern  . Not on file  Social History Narrative   Lives with wife and 2 children   Right Handed   Drinks 8-10 cups caffeine daily   Social Determinants of Health   Financial Resource Strain:   . Difficulty of Paying Living Expenses: Not on file  Food Insecurity:   . Worried About Charity fundraiser in the Last Year: Not on file  . Ran Out of Food  in the Last Year: Not on file  Transportation Needs:   . Lack of Transportation (Medical): Not on file  . Lack of Transportation (Non-Medical): Not on file  Physical Activity:   . Days of Exercise per Week: Not on file  . Minutes of Exercise per Session: Not on file  Stress:   . Feeling of Stress : Not on file  Social Connections:   . Frequency of Communication with Friends and Family: Not on file  . Frequency of Social Gatherings with Friends and Family: Not on file  . Attends Religious Services: Not on file  . Active Member of Clubs or Organizations: Not on file  . Attends Archivist Meetings: Not on file  . Marital Status: Not on file  Intimate Partner Violence:   . Fear of Current or Ex-Partner: Not on file  . Emotionally Abused: Not on file  . Physically Abused: Not on file  . Sexually Abused: Not on file    Medications:   Current Outpatient Medications on File Prior to Visit  Medication Sig Dispense Refill  . aspirin 81 MG tablet Take 1 tablet (81 mg total) by mouth daily. 30 tablet 3  . escitalopram (LEXAPRO) 10 MG tablet Take 20 mg by mouth daily.     . Melatonin 3 MG TABS Take 6 mg by mouth at bedtime.    . metoprolol succinate (TOPROL-XL) 25 MG 24 hr tablet TAKE 1 TABLET BY MOUTH TWICE DAILY(WITH MEALS OR IMMEDIATELY FOLLOWING ONE) 180 tablet 3  . mirtazapine (REMERON) 7.5 MG tablet Take 7.5 mg by mouth at bedtime.    . Rosuvastatin Calcium 10 MG CPSP Take 10 mg by mouth daily.     . Tamsulosin HCl (FLOMAX) 0.4 MG CAPS Take 0.4 mg by mouth daily.    . valsartan (DIOVAN) 40 MG tablet Take 1 tablet (40 mg total) by mouth daily. 30 tablet 6  . warfarin (COUMADIN) 10 MG tablet Take 1 tablet (10 mg total) by mouth daily. Take 1 tablet daily or as directed by the coumadin clinic 40 tablet 2   No current facility-administered medications on file prior to visit.    Allergies:   Allergies  Allergen Reactions  . Zocor [Simvastatin]     Muscle pain    Physical  Exam General: well developed, well nourished middle-aged Caucasian male, seated, in no evident distress Head: head normocephalic and atraumatic.   Neck: supple with no carotid or supraclavicular bruits Cardiovascular: regular rate and rhythm, no murmurs mechanical aortic valve sound. Musculoskeletal: no deformity Skin:  no rash/petichiae Vascular:  Normal pulses all extremities  Neurologic Exam Mental Status: Awake and fully alert. Oriented to place and time. Recent and remote memory intact. Attention span, concentration and fund of knowledge appropriate. Mood and affect appropriate.  Recall 3/3.  Able to name 12 animals that can walk on 4 legs.  Able to copy intersecting pentagons well.  Clock drawing 4/4. Cranial Nerves: Fundoscopic exam reveals sharp disc margins. Pupils equal, briskly reactive to light. Extraocular movements full without nystagmus. Visual fields full to confrontation. Hearing intact. Facial sensation intact. Face, tongue, palate moves normally and symmetrically.  Motor: Normal bulk and tone. Normal strength in all tested extremity muscles.  Diminished fine finger movements on the left.  Orbits right over left upper extremity.  Foot tapping slightly diminished on the left compared to the right. Sensory.: intact to touch , pinprick , position and vibratory sensation.  Coordination: Rapid alternating movements normal in all extremities. Finger-to-nose and heel-to-shin performed accurately bilaterally. Gait and Station: Arises from chair without difficulty. Stance is normal. Gait demonstrates normal stride length and balance . Able to heel, toe and tandem walk without difficulty.  Reflexes: 1+ and symmetric. Toes downgoing.   NIHSS  1 Modified Rankin  2   ASSESSMENT: 44 year old male with history of bicuspid aortic valve disorder status post multiple surgeries and perioperative strokes following open heart surgery in June 2019 with mild residual cognitive impairment but no  major physical deficits.  He has mechanical heart valve and is now on long-term anticoagulation.    PLAN: I had a long discussion with the patient regarding his remote perioperative strokes and residual memory loss and short-term memory difficulties.  I recommend checking memory panel labs, EEG, MRI scan of the brain, lipid profile hemoglobin A1c and carotid Dopplers.  Continue warfarin for stroke prevention with INR goal between 2-3.  I recommend he discuss with his cardiologist whether he needs additional with aspirin 81 mg daily as it may increase bleeding risk.  I encouraged him to increase participation in cognitively challenging activities like solving crossword puzzles, playing bridge and sodoku.  We also discussed memory compensation strategies.  I recommend he start taking Cerefolin NAC 1 tablet daily for memory enhancement.  He will return for follow-up in the future in 6 weeks or call earlier if necessary.  Greater than 50% time during this 45-minute consultation visit was spent on counseling and coordination of care about his remote perioperative strokes and residual mild cognitive impairment and answering questions Antony Contras, MD Note: This document was prepared with digital dictation and possible smart phrase technology. Any transcriptional errors that result from this process are unintentional.

## 2019-11-08 NOTE — Telephone Encounter (Signed)
medcost order sent to GI. They will obtain the auth and reach out to the patient to schedule.  

## 2019-11-09 ENCOUNTER — Other Ambulatory Visit: Payer: Self-pay | Admitting: Neurology

## 2019-11-09 LAB — HEMOGLOBIN A1C
Est. average glucose Bld gHb Est-mCnc: 111 mg/dL
Hgb A1c MFr Bld: 5.5 % (ref 4.8–5.6)

## 2019-11-09 LAB — DEMENTIA PANEL
Homocysteine: 12.2 umol/L (ref 0.0–14.5)
RPR Ser Ql: NONREACTIVE
TSH: 1.67 u[IU]/mL (ref 0.450–4.500)
Vitamin B-12: 468 pg/mL (ref 232–1245)

## 2019-11-09 LAB — LIPID PANEL
Chol/HDL Ratio: 3.1 ratio (ref 0.0–5.0)
Cholesterol, Total: 169 mg/dL (ref 100–199)
HDL: 54 mg/dL (ref >39)
LDL Chol Calc (NIH): 98 mg/dL (ref 0–99)
Triglycerides: 92 mg/dL (ref 0–149)
VLDL Cholesterol Cal: 17 mg/dL (ref 5–40)

## 2019-11-09 NOTE — Progress Notes (Signed)
Kindly inform the patient that cholesterol profile, screening test for diabetes and all the lab work for reversible causes of memory loss which is back so far were normal

## 2019-11-10 ENCOUNTER — Other Ambulatory Visit: Payer: Self-pay

## 2019-11-10 ENCOUNTER — Ambulatory Visit (INDEPENDENT_AMBULATORY_CARE_PROVIDER_SITE_OTHER): Payer: PRIVATE HEALTH INSURANCE | Admitting: *Deleted

## 2019-11-10 DIAGNOSIS — Z5181 Encounter for therapeutic drug level monitoring: Secondary | ICD-10-CM | POA: Diagnosis not present

## 2019-11-10 DIAGNOSIS — I359 Nonrheumatic aortic valve disorder, unspecified: Secondary | ICD-10-CM | POA: Diagnosis not present

## 2019-11-10 DIAGNOSIS — Z7901 Long term (current) use of anticoagulants: Secondary | ICD-10-CM

## 2019-11-10 LAB — POCT INR: INR: 3.6 — AB (ref 2.0–3.0)

## 2019-11-10 NOTE — Telephone Encounter (Signed)
-----   Message from Garvin Fila, MD sent at 11/09/2019  8:39 AM EDT ----- Corey Rose inform the patient that cholesterol profile, screening test for diabetes and all the lab work for reversible causes of memory loss which is back so far were normal

## 2019-11-10 NOTE — Patient Instructions (Signed)
Description   Do not take any Warfarin today, then start taking 1 tablet daily excpet for 1/2 a tablet on Wednesdays. Recheck INR in 3 weeks.  Call Coumadin Clinic [phone] with new medications, bleeding or questions.

## 2019-11-10 NOTE — Telephone Encounter (Signed)
I sent patient a MyChart message informing him of these results.

## 2019-11-15 ENCOUNTER — Encounter: Payer: Self-pay | Admitting: *Deleted

## 2019-11-21 ENCOUNTER — Other Ambulatory Visit: Payer: PRIVATE HEALTH INSURANCE

## 2019-11-22 ENCOUNTER — Ambulatory Visit
Admission: RE | Admit: 2019-11-22 | Discharge: 2019-11-22 | Disposition: A | Discharge status: Home / Self Care | Payer: PRIVATE HEALTH INSURANCE | Source: Ambulatory Visit | Attending: Neurology | Admitting: Neurology

## 2019-11-22 ENCOUNTER — Ambulatory Visit: Payer: PRIVATE HEALTH INSURANCE | Admitting: Gastroenterology

## 2019-11-22 ENCOUNTER — Other Ambulatory Visit: Payer: Self-pay

## 2019-11-22 DIAGNOSIS — R413 Other amnesia: Secondary | ICD-10-CM

## 2019-11-22 MED ORDER — GADOBENATE DIMEGLUMINE 529 MG/ML IV SOLN
20.0000 mL | Freq: Once | INTRAVENOUS | Status: AC | PRN
  Administered 2019-11-22: 20 mL via INTRAVENOUS

## 2019-11-23 ENCOUNTER — Ambulatory Visit (INDEPENDENT_AMBULATORY_CARE_PROVIDER_SITE_OTHER): Payer: PRIVATE HEALTH INSURANCE | Admitting: Internal Medicine

## 2019-11-23 ENCOUNTER — Encounter: Payer: Self-pay | Admitting: *Deleted

## 2019-11-23 VITALS — BP 98/60 | HR 59 | Ht 75.0 in | Wt 219.0 lb

## 2019-11-23 DIAGNOSIS — R198 Other specified symptoms and signs involving the digestive system and abdomen: Secondary | ICD-10-CM | POA: Diagnosis not present

## 2019-11-23 DIAGNOSIS — Z7901 Long term (current) use of anticoagulants: Secondary | ICD-10-CM | POA: Diagnosis not present

## 2019-11-23 DIAGNOSIS — Z1211 Encounter for screening for malignant neoplasm of colon: Secondary | ICD-10-CM

## 2019-11-23 DIAGNOSIS — Z952 Presence of prosthetic heart valve: Secondary | ICD-10-CM

## 2019-11-23 DIAGNOSIS — K5900 Constipation, unspecified: Secondary | ICD-10-CM

## 2019-11-23 MED ORDER — SUTAB 1479-225-188 MG PO TABS: ORAL_TABLET | ORAL | 0 refills | Status: DC

## 2019-11-23 NOTE — Patient Instructions (Addendum)
You have been scheduled for a colonoscopy. Please follow written instructions given to you at your visit today.  Please pick up your prep supplies at the pharmacy within the next 1-3 days. If you use inhalers (even only as needed), please bring them with you on the day of your procedure.  Continue coumadin for your procedure.  Continue metamucil.  If you are age 46 or younger, your body mass index should be between 19-25. Your Body mass index is 27.37 kg/m. If this is out of the aformentioned range listed, please consider follow up with your Primary Care Provider.   Due to recent changes in healthcare laws, you may see the results of your imaging and laboratory studies on MyChart before your provider has had a chance to review them.  We understand that in some cases there may be results that are confusing or concerning to you. Not all laboratory results come back in the same time frame and the provider may be waiting for multiple results in order to interpret others.  Please give [place] 48 hours in order for your provider to thoroughly review all the results before contacting the office for clarification of your results.

## 2019-11-23 NOTE — Progress Notes (Signed)
Patient ID: Corey Rose, male   DOB: May 29, 1975, 44 y.o.   MRN: [id number] HPI: Corey Rose is a 44 year old male with a past medical history of remote endocarditis, TAVR June 5035 with very complicated course including migration of valve, severe aortic insufficiency, V. fib arrest, rehab in [place] after multiple CVAs, subsequent mechanical AVR at [facility] clinic later in 2019 on warfarin who is seen in consultation at the request of Dr. Joylene Draft to discuss colon cancer screening and mild change in bowel habit.  He is here alone today.  He reports that after a very eventful 2019 he has slowly recovered very well and has resumed most of his normal activities.  He is treated with warfarin therapy and follows closely with Dr. Aundra Dubin with cardiology and also with Dr. Leonie Man at neurology.  His goal INR is between 1.5 and 2.5.  He takes low-dose aspirin, valsartan and metoprolol for blood pressure and heart rate control.  He takes rosuvastatin for cholesterol.  He also takes escitalopram, mirtazapine and Cerefolin.  He takes tamsulosin for history of prostatitis.  From a GI perspective ever since his medical event in 2019 where he required a medically induced coma for more than a week he has noticed a slight change to his bowel habit.  Previously he would have bowel movement daily and now he will have a bowel movement daily to every other day but he is noticed that he has to strain to have a bowel movement.  It is hard to have initiation of bowel movement and at times he feels incomplete bowel movement.  He notes his stools are not hard and he does not see rectal bleeding.  He is not having abdominal pain.  No upper GI complaint including no nausea, vomiting, dysphagia or odynophagia.  He does not have issues with heartburn.  He does report having had a colonoscopy 15 years ago or so with Dr. Velora Heckler for rectal bleeding.  He reports at that time the colonoscopy was normal but he was found to have internal  hemorrhoids.  He has not had subsequent issues with bleeding since this colonoscopy years ago.  He does use Metamucil on a near daily basis.  His family history is notable for father with colon polyps.  Past Medical History:  Diagnosis Date  . Anxiety disorder   . 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 Bacterial endocarditis    age 51  . Bladder stones   . Cerebral infarction ([place])   . History of aortic valve replacement 1997, 2009   . Hyperlipidemia   . Internal hemorrhoids   . Late, effect, cerebrovascular disease   . Memory loss   . Mild cognitive impairment   . Prostatitis 2010    Dr Merryl Hacker , [facility]  . Prostatitis   . Rosacea   . Stroke ([place])   . TIA (transient ischemic attack)     Past Surgical History:  Procedure Laterality Date  . ANOMALOUS PULMONARY VENOUS RETURN REPAIR, TOTAL  46/56/8127   @ [facility]; complicated by proximal migration of valve; udden AI, V-Fib arrest, were able to get another valve seated. Left with moderate AR afterward, had innumerable small strokes and hemorrhages whtrought brain. 06/26/17 to [facility] in [place] for three weeks of rehab. Largest stroke left posterior frontal lbe. Was on ECMO and ventricular assist. Had a bleed and infection at ECMO cannulation i  . AORTIC VALVE REPLACEMENT  1997    [facility];post Endocarditis   . AORTIC VALVE REPLACEMENT  2008  [facility]; Dr Clementeen Graham  . bladder stones removed  2010   [facility]  . MITRAL VALVE SURGERY  09/19/2017   [facility]  . TEE WITHOUT CARDIOVERSION N/A 08/11/2017   Procedure: TRANSESOPHAGEAL ECHOCARDIOGRAM (TEE);  Surgeon: Larey Dresser, MD;  Location: [facility] ENDOSCOPY;  Service: Cardiovascular;  Laterality: N/A;  . VASECTOMY  2017  . WISDOM TOOTH EXTRACTION      Outpatient Medications Prior to Visit  Medication Sig Dispense Refill  . aspirin 81 MG tablet Take 1 tablet (81 mg total) by mouth daily. 30 tablet 3  . escitalopram (LEXAPRO) 10 MG tablet Take 20 mg by mouth daily.     . Melatonin 3  MG TABS Take 6 mg by mouth at bedtime.    . Methylfol-Methylcob-Acetylcyst (CEREFOLIN NAC) 6-2-600 MG TABS Take 1 tablet by mouth every morning. 90 tablet 3  . metoprolol succinate (TOPROL-XL) 25 MG 24 hr tablet TAKE 1 TABLET BY MOUTH TWICE DAILY(WITH MEALS OR IMMEDIATELY FOLLOWING ONE) 180 tablet 3  . mirtazapine (REMERON) 7.5 MG tablet Take 7.5 mg by mouth at bedtime.    . Rosuvastatin Calcium 10 MG CPSP Take 10 mg by mouth daily.     . Tamsulosin HCl (FLOMAX) 0.4 MG CAPS Take 0.4 mg by mouth daily.    . valsartan (DIOVAN) 40 MG tablet Take 1 tablet (40 mg total) by mouth daily. 30 tablet 6  . warfarin (COUMADIN) 10 MG tablet Take 1 tablet (10 mg total) by mouth daily. Take 1 tablet daily or as directed by the coumadin clinic 40 tablet 2   No facility-administered medications prior to visit.    Allergies  Allergen Reactions  . Zocor [Simvastatin]     Muscle pain    Family History  Problem Relation Age of Onset  . Hyperlipidemia Mother   . Skin cancer Mother        ? melanoma, basal & squamous cell  . Hyperlipidemia Father   . Atrial fibrillation Father   . Skin cancer Father   . Colon polyps Father   . Depression Brother   . Stroke Neg Hx   . Diabetes Neg Hx   . Heart attack Neg Hx     Social History   Tobacco Use  . Smoking status: Former Smoker    Start date: 01/07/1994    Quit date: 01/07/1994    Years since quitting: 25.8  . Smokeless tobacco: Never Used  . Tobacco comment: smoked 1990-1996, up to 1 pp week  Substance Use Topics  . Alcohol use: No    Alcohol/week: 0.0 standard drinks    Comment:  quit 2009  . Drug use: No    ROS: As per history of present illness, otherwise negative  There were no vitals taken for this visit. Constitutional: Well-developed and well-nourished. No distress. HEENT: Normocephalic and atraumatic.   No scleral icterus. Neck: Neck supple. Trachea midline. Cardiovascular: Normal rate, regular rhythm and intact distal pulses.   Mechanical S2 Pulmonary/chest: Effort normal and breath sounds normal. No wheezing, rales or rhonchi. Abdominal: Soft, nontender, nondistended. Bowel sounds active throughout. There are no masses palpable. No hepatosplenomegaly. Extremities: no clubbing, cyanosis, or edema Neurological: Alert and oriented to person place and time. Skin: Skin is warm and dry.  Psychiatric: Normal mood and affect. Behavior is normal.  RELEVANT LABS AND IMAGING: CBC    Component Value Date/Time   WBC 5.5 01/21/2018 0943   RBC 5.22 01/21/2018 0943   HGB 14.2 01/21/2018 0943   HCT 44.6 01/21/2018 0943  PLT 227 01/21/2018 0943   MCV 85.4 01/21/2018 0943   MCH 27.2 01/21/2018 0943   MCHC 31.8 01/21/2018 0943   RDW 17.4 (H) 01/21/2018 0943   LYMPHSABS 1.6 01/25/2013 0808   MONOABS 0.5 01/25/2013 0808   EOSABS 0.1 01/25/2013 0808   BASOSABS 0.0 01/25/2013 0808    CMP     Component Value Date/Time   NA 140 06/23/2018 1515   K 4.1 06/23/2018 1515   CL 106 06/23/2018 1515   CO2 28 06/23/2018 1515   GLUCOSE 87 06/23/2018 1515   BUN 12 06/23/2018 1515   CREATININE 1.18 06/23/2018 1515   CALCIUM 9.2 06/23/2018 1515   PROT 6.6 03/21/2014 0814   ALBUMIN 4.2 03/21/2014 0814   AST 20 03/21/2014 0814   ALT 18 03/21/2014 0814   ALKPHOS 57 03/21/2014 0814   BILITOT 0.5 03/21/2014 0814   GFRNONAA >60 06/23/2018 1515   GFRAA >60 06/23/2018 1515    ASSESSMENT/PLAN: 44 year old male with a past medical history of remote endocarditis, TAVR June 1962 with very complicated course including migration of valve, severe aortic insufficiency, V. fib arrest, rehab in [place] after multiple CVAs, subsequent mechanical AVR at [facility] clinic later in 2019 on warfarin who is seen in consultation at the request of Dr. Joylene Draft to discuss colon cancer screening and mild change in bowel habit.'  1. Colon cancer screening --we discussed the new guidelines supporting colonoscopy for average risk colorectal cancer screening  beginning at age 59.  This would include him as he is now 44 years old.  We discussed how this is recommended but his chronic anticoagulation, mechanical AVR, and significant issues occurring around attempted valve replacement in 2019 make him higher risk for stopping his anticoagulation.  We discussed this at length today and have decided to pursue screening colonoscopy without interrupting warfarin therapy.  I explained to him that if he were to have very small polyps in need of resection this could likely safely be done on warfarin therapy.  However, should medium or large polyps be found we would likely have to schedule colonoscopy for a future date when his warfarin can be held.  I would anticipate he would need a Lovenox bridge but we would involve cardiology at that time and follow their recommendation.  He understands and is more comfortable proceeding as we have discussed and understands that we may have to repeat a colonoscopy in short order if polypectomy is needed for medium to large lesions. --Colonoscopy in the [place] without interrupting anticoagulation  2.  Change in bowel habit/mild defecation dysfunction --he has noticed harder time initiating and passing stool.  This could be secondary to internal hemorrhoids but also could be somewhat neurologic given his strokes and neurologic impairments after aortic valve surgery in 2019.  Symptoms date back to this time exactly.  We will evaluate anorectal tone but also rule out structural lesions at upcoming colonoscopy. --I recommended he continue Metamucil daily       IW:[id number], Elta [place], [address] [place],  [place] [address]

## 2019-11-25 ENCOUNTER — Other Ambulatory Visit: Payer: Self-pay

## 2019-11-25 ENCOUNTER — Ambulatory Visit (HOSPITAL_COMMUNITY)
Admission: RE | Admit: 2019-11-25 | Discharge: 2019-11-25 | Disposition: A | Discharge status: Home / Self Care | Payer: PRIVATE HEALTH INSURANCE | Source: Ambulatory Visit | Attending: Neurology | Admitting: Neurology

## 2019-11-25 DIAGNOSIS — E785 Hyperlipidemia, unspecified: Secondary | ICD-10-CM | POA: Insufficient documentation

## 2019-11-25 DIAGNOSIS — I6990 Unspecified sequelae of unspecified cerebrovascular disease: Secondary | ICD-10-CM | POA: Diagnosis not present

## 2019-11-25 DIAGNOSIS — I69998 Other sequelae following unspecified cerebrovascular disease: Secondary | ICD-10-CM | POA: Insufficient documentation

## 2019-11-25 DIAGNOSIS — I6529 Occlusion and stenosis of unspecified carotid artery: Secondary | ICD-10-CM | POA: Insufficient documentation

## 2019-11-25 NOTE — Progress Notes (Signed)
Carotid artery duplex has been completed. Preliminary results can be found in CV Proc through chart review.   11/25/19 2:07 PM Carlos Levering RVT

## 2019-11-30 ENCOUNTER — Encounter: Payer: Self-pay | Admitting: *Deleted

## 2019-11-30 NOTE — Progress Notes (Signed)
Kindly inform the patient her MRI scan of the brain shows evidence of a previous stroke on the left side as well as changes of hardening of the arteries.  No new or worrisome finding.

## 2019-11-30 NOTE — Progress Notes (Signed)
Kindly inform the patient that carotid ultrasound study showed no significant narrowing of either carotid artery in the neck.

## 2019-12-06 ENCOUNTER — Other Ambulatory Visit: Payer: Self-pay

## 2019-12-06 ENCOUNTER — Ambulatory Visit (INDEPENDENT_AMBULATORY_CARE_PROVIDER_SITE_OTHER): Payer: PRIVATE HEALTH INSURANCE

## 2019-12-06 DIAGNOSIS — I359 Nonrheumatic aortic valve disorder, unspecified: Secondary | ICD-10-CM | POA: Diagnosis not present

## 2019-12-06 DIAGNOSIS — Z7901 Long term (current) use of anticoagulants: Secondary | ICD-10-CM

## 2019-12-06 LAB — POCT INR: INR: 3 (ref 2.0–3.0)

## 2019-12-06 NOTE — Patient Instructions (Signed)
Description   Take 1/2 tablet today, then start taking 1 tablet daily excpet for 1/2 a tablet on Wednesdays and Saturdays. Recheck INR in 10 days prior to colonoscopy on 12/20/19.  Call [facility] [phone] with new medications, bleeding or questions.

## 2019-12-17 ENCOUNTER — Other Ambulatory Visit: Payer: Self-pay

## 2019-12-17 ENCOUNTER — Ambulatory Visit (INDEPENDENT_AMBULATORY_CARE_PROVIDER_SITE_OTHER): Payer: PRIVATE HEALTH INSURANCE | Admitting: *Deleted

## 2019-12-17 DIAGNOSIS — Z7901 Long term (current) use of anticoagulants: Secondary | ICD-10-CM

## 2019-12-17 DIAGNOSIS — I359 Nonrheumatic aortic valve disorder, unspecified: Secondary | ICD-10-CM | POA: Diagnosis not present

## 2019-12-17 LAB — POCT INR: INR: 2 (ref 2.0–3.0)

## 2019-12-17 NOTE — Patient Instructions (Addendum)
Description   Continue taking 1 tablet daily excpet for 1/2 tablet on Wednesdays and Saturdays. Recheck INR in 4 weeks (colonoscopy on 12/20/19-no hold). Call [facility] [phone] with new medications, bleeding or questions.

## 2019-12-20 ENCOUNTER — Ambulatory Visit (AMBULATORY_SURGERY_CENTER): Payer: PRIVATE HEALTH INSURANCE | Admitting: Internal Medicine

## 2019-12-20 ENCOUNTER — Encounter: Payer: Self-pay | Admitting: Internal Medicine

## 2019-12-20 ENCOUNTER — Other Ambulatory Visit: Payer: Self-pay

## 2019-12-20 VITALS — BP 113/71 | HR 46 | Temp 98.7°F | Resp 13 | Ht 75.0 in | Wt 219.0 lb

## 2019-12-20 DIAGNOSIS — D122 Benign neoplasm of ascending colon: Secondary | ICD-10-CM | POA: Diagnosis not present

## 2019-12-20 DIAGNOSIS — Z1211 Encounter for screening for malignant neoplasm of colon: Secondary | ICD-10-CM | POA: Diagnosis present

## 2019-12-20 MED ORDER — SODIUM CHLORIDE 0.9 % IV SOLN: 500.0000 mL | Freq: Once | INTRAVENOUS | Status: DC

## 2019-12-20 NOTE — Op Note (Signed)
[place] Patient Name: Corey Rose Procedure Date: 12/20/2019 3:02 PM MRN: [id number] Endoscopist: Jerene Bears , MD Age: 44 Referring MD:  Date of Birth: 08-15-1975 Gender: Male Account #: [id number] Procedure:                Colonoscopy Indications:              Screening for colorectal malignant neoplasm, This                            is the patient's first colonoscopy Medicines:                Monitored Anesthesia Care Procedure:                Pre-Anesthesia Assessment:                           - Prior to the procedure, a History and Physical                            was performed, and patient medications and                            allergies were reviewed. The patient's tolerance of                            previous anesthesia was also reviewed. The risks                            and benefits of the procedure and the sedation                            options and risks were discussed with the patient.                            All questions were answered, and informed consent                            was obtained. Prior Anticoagulants: The patient has                            taken Coumadin (warfarin), last dose was day of                            procedure. ASA Grade Assessment: III - A patient                            with severe systemic disease. After reviewing the                            risks and benefits, the patient was deemed in                            satisfactory condition to undergo the procedure.  After obtaining informed consent, the colonoscope                            was passed under direct vision. Throughout the                            procedure, the patient's blood pressure, pulse, and                            oxygen saturations were monitored continuously. The                            Colonoscope was introduced through the anus and                            advanced to the cecum,  identified by appendiceal                            orifice and ileocecal valve. The colonoscopy was                            performed without difficulty. The patient tolerated                            the procedure well. The quality of the bowel                            preparation was good. The ileocecal valve,                            appendiceal orifice, and rectum were photographed. Scope In: 3:09:17 PM Scope Out: 3:25:13 PM Scope Withdrawal Time: 0 hours 13 minutes 43 seconds  Total Procedure Duration: 0 hours 15 minutes 56 seconds  Findings:                 The digital rectal exam was normal.                           A 4 mm polyp was found in the proximal ascending                            colon. The polyp was sessile. The polyp was removed                            with a cold biopsy forceps. Resection and retrieval                            were complete.                           The exam was otherwise without abnormality on                            direct and retroflexion views. Complications:  No immediate complications. Estimated Blood Loss:     Estimated blood loss was minimal. Impression:               - One 4 mm polyp in the proximal ascending colon,                            removed with a cold biopsy forceps. Resected and                            retrieved.                           - The examination was otherwise normal on direct                            and retroflexion views. Recommendation:           - Patient has a contact number available for                            emergencies. The signs and symptoms of potential                            delayed complications were discussed with the                            patient. Return to normal activities tomorrow.                            Written discharge instructions were provided to the                            patient.                           - Resume previous diet.                            - Continue present medications including daily                            Metamucil.                           - Await pathology results.                           - Repeat colonoscopy is recommended. The                            colonoscopy date will be determined after pathology                            results from today's exam become available for                            review. Corey Rose Corey Reddinger,  MD 12/20/2019 3:28:46 PM This report has been signed electronically.

## 2019-12-20 NOTE — Progress Notes (Signed)
Medical history reviewed with no changes noted. VS assessed by C.W 

## 2019-12-20 NOTE — Progress Notes (Signed)
Called to room to assist during endoscopic procedure.  Patient ID and intended procedure confirmed with present staff. Received instructions for my participation in the procedure from the performing physician.  

## 2019-12-20 NOTE — Patient Instructions (Signed)
Please read handouts provided. Continue present medications. Await pathology results. Include taking daily Metamucil.     YOU HAD AN ENDOSCOPIC PROCEDURE TODAY AT [facility]:   Refer to the procedure report that was given to you for any specific questions about what was found during the examination.  If the procedure report does not answer your questions, please call your gastroenterologist to clarify.  If you requested that your care partner not be given the details of your procedure findings, then the procedure report has been included in a sealed envelope for you to review at your convenience later.  YOU SHOULD EXPECT: Some feelings of bloating in the abdomen. Passage of more gas than usual.  Walking can help get rid of the air that was put into your GI tract during the procedure and reduce the bloating. If you had a lower endoscopy (such as a colonoscopy or flexible sigmoidoscopy) you may notice spotting of blood in your stool or on the toilet paper. If you underwent a bowel prep for your procedure, you may not have a normal bowel movement for a few days.  Please Note:  You might notice some irritation and congestion in your nose or some drainage.  This is from the oxygen used during your procedure.  There is no need for concern and it should clear up in a day or so.  SYMPTOMS TO REPORT IMMEDIATELY:   Following lower endoscopy (colonoscopy or flexible sigmoidoscopy):  Excessive amounts of blood in the stool  Significant tenderness or worsening of abdominal pains  Swelling of the abdomen that is new, acute  Fever of 100F or higher   For urgent or emergent issues, a gastroenterologist can be reached at any hour by calling [phone]. Do not use MyChart messaging for urgent concerns.    DIET:  We do recommend a small meal at first, but then you may proceed to your regular diet.  Drink plenty of fluids but you should avoid alcoholic beverages for 24  hours.  ACTIVITY:  You should plan to take it easy for the rest of today and you should NOT DRIVE or use heavy machinery until tomorrow (because of the sedation medicines used during the test).    FOLLOW UP: Our staff will call the number listed on your records 48-72 hours following your procedure to check on you and address any questions or concerns that you may have regarding the information given to you following your procedure. If we do not reach you, we will leave a message.  We will attempt to reach you two times.  During this call, we will ask if you have developed any symptoms of COVID 19. If you develop any symptoms (ie: fever, flu-like symptoms, shortness of breath, cough etc.) before then, please call [phone].  If you test positive for Covid 19 in the 2 weeks post procedure, please call and report this information to [place].    If any biopsies were taken you will be contacted by phone or by letter within the next 1-3 weeks.  Please call us at [phone] if you have not heard about the biopsies in 3 weeks.    SIGNATURES/CONFIDENTIALITY: You and/or your care partner have signed paperwork which will be entered into your electronic medical record.  These signatures attest to the fact that that the information above on your After Visit Summary has been reviewed and is understood.  Full responsibility of the confidentiality of this discharge information lies with you and/or your care-partner.

## 2019-12-20 NOTE — Progress Notes (Signed)
pt tolerated well. VSS. awake and to recovery. Report given to RN.  

## 2019-12-22 ENCOUNTER — Telehealth: Payer: Self-pay | Admitting: *Deleted

## 2019-12-22 ENCOUNTER — Telehealth: Payer: Self-pay

## 2019-12-22 NOTE — Telephone Encounter (Signed)
First attempt, left VM.  

## 2019-12-22 NOTE — Telephone Encounter (Signed)
  Follow up Call-  Call back number 12/20/2019  Post procedure Call Back phone  # [phone]  Permission to leave phone message Yes  Some recent data might be hidden     Patient questions:  Do you have a fever, pain , or abdominal swelling? No. Pain Score  0 *  Have you tolerated food without any problems? Yes.    Have you been able to return to your normal activities? Yes.    Do you have any questions about your discharge instructions: Diet   No. Medications  No. Follow up visit  No.  Do you have questions or concerns about your Care? No.  Actions: * If pain score is 4 or above: 1. No action needed, pain <4.Have you developed a fever since your procedure? no  2.   Have you had an respiratory symptoms (SOB or cough) since your procedure? no  3.   Have you tested positive for COVID 19 since your procedure no  4.   Have you had any family members/close contacts diagnosed with the COVID 19 since your procedure?  no   If yes to any of these questions please route to Joylene John, RN and Joella Prince, RN

## 2020-01-03 ENCOUNTER — Encounter: Payer: Self-pay | Admitting: Internal Medicine

## 2020-01-11 ENCOUNTER — Ambulatory Visit (INDEPENDENT_AMBULATORY_CARE_PROVIDER_SITE_OTHER): Payer: PRIVATE HEALTH INSURANCE | Admitting: *Deleted

## 2020-01-11 ENCOUNTER — Other Ambulatory Visit: Payer: Self-pay

## 2020-01-11 DIAGNOSIS — Z7901 Long term (current) use of anticoagulants: Secondary | ICD-10-CM

## 2020-01-11 DIAGNOSIS — I359 Nonrheumatic aortic valve disorder, unspecified: Secondary | ICD-10-CM

## 2020-01-11 DIAGNOSIS — Z5181 Encounter for therapeutic drug level monitoring: Secondary | ICD-10-CM

## 2020-01-11 LAB — POCT INR: INR: 2.3 (ref 2.0–3.0)

## 2020-01-11 NOTE — Patient Instructions (Signed)
Description   Continue taking 1 tablet daily excpet for 1/2 tablet on Wednesdays and Saturdays. Recheck INR in 5 weeks. Call [facility] [phone] with new medications, bleeding or questions.

## 2020-01-31 ENCOUNTER — Telehealth (HOSPITAL_COMMUNITY): Payer: Self-pay | Admitting: *Deleted

## 2020-01-31 DIAGNOSIS — I359 Nonrheumatic aortic valve disorder, unspecified: Secondary | ICD-10-CM

## 2020-01-31 NOTE — Telephone Encounter (Signed)
Order placed, pt does not have scheduled appt, message sent to sch to arrange.

## 2020-01-31 NOTE — Telephone Encounter (Signed)
-----   Message from Larey Dresser, MD sent at 01/29/2020  8:54 PM EST ----- He needs an echo the day he has his next appointment with me (looks like 02/15/20).

## 2020-02-08 IMAGING — DX DG CHEST 2V
2 series · 2 of 2 positions shown · non-contrast
Comparison: Cardiac MRI 02/07/2015.

CLINICAL DATA: 41-year-old male status post aortic valve
replacement 2 weeks ago.

EXAM:
CHEST - 2 VIEW

[w chest pa]
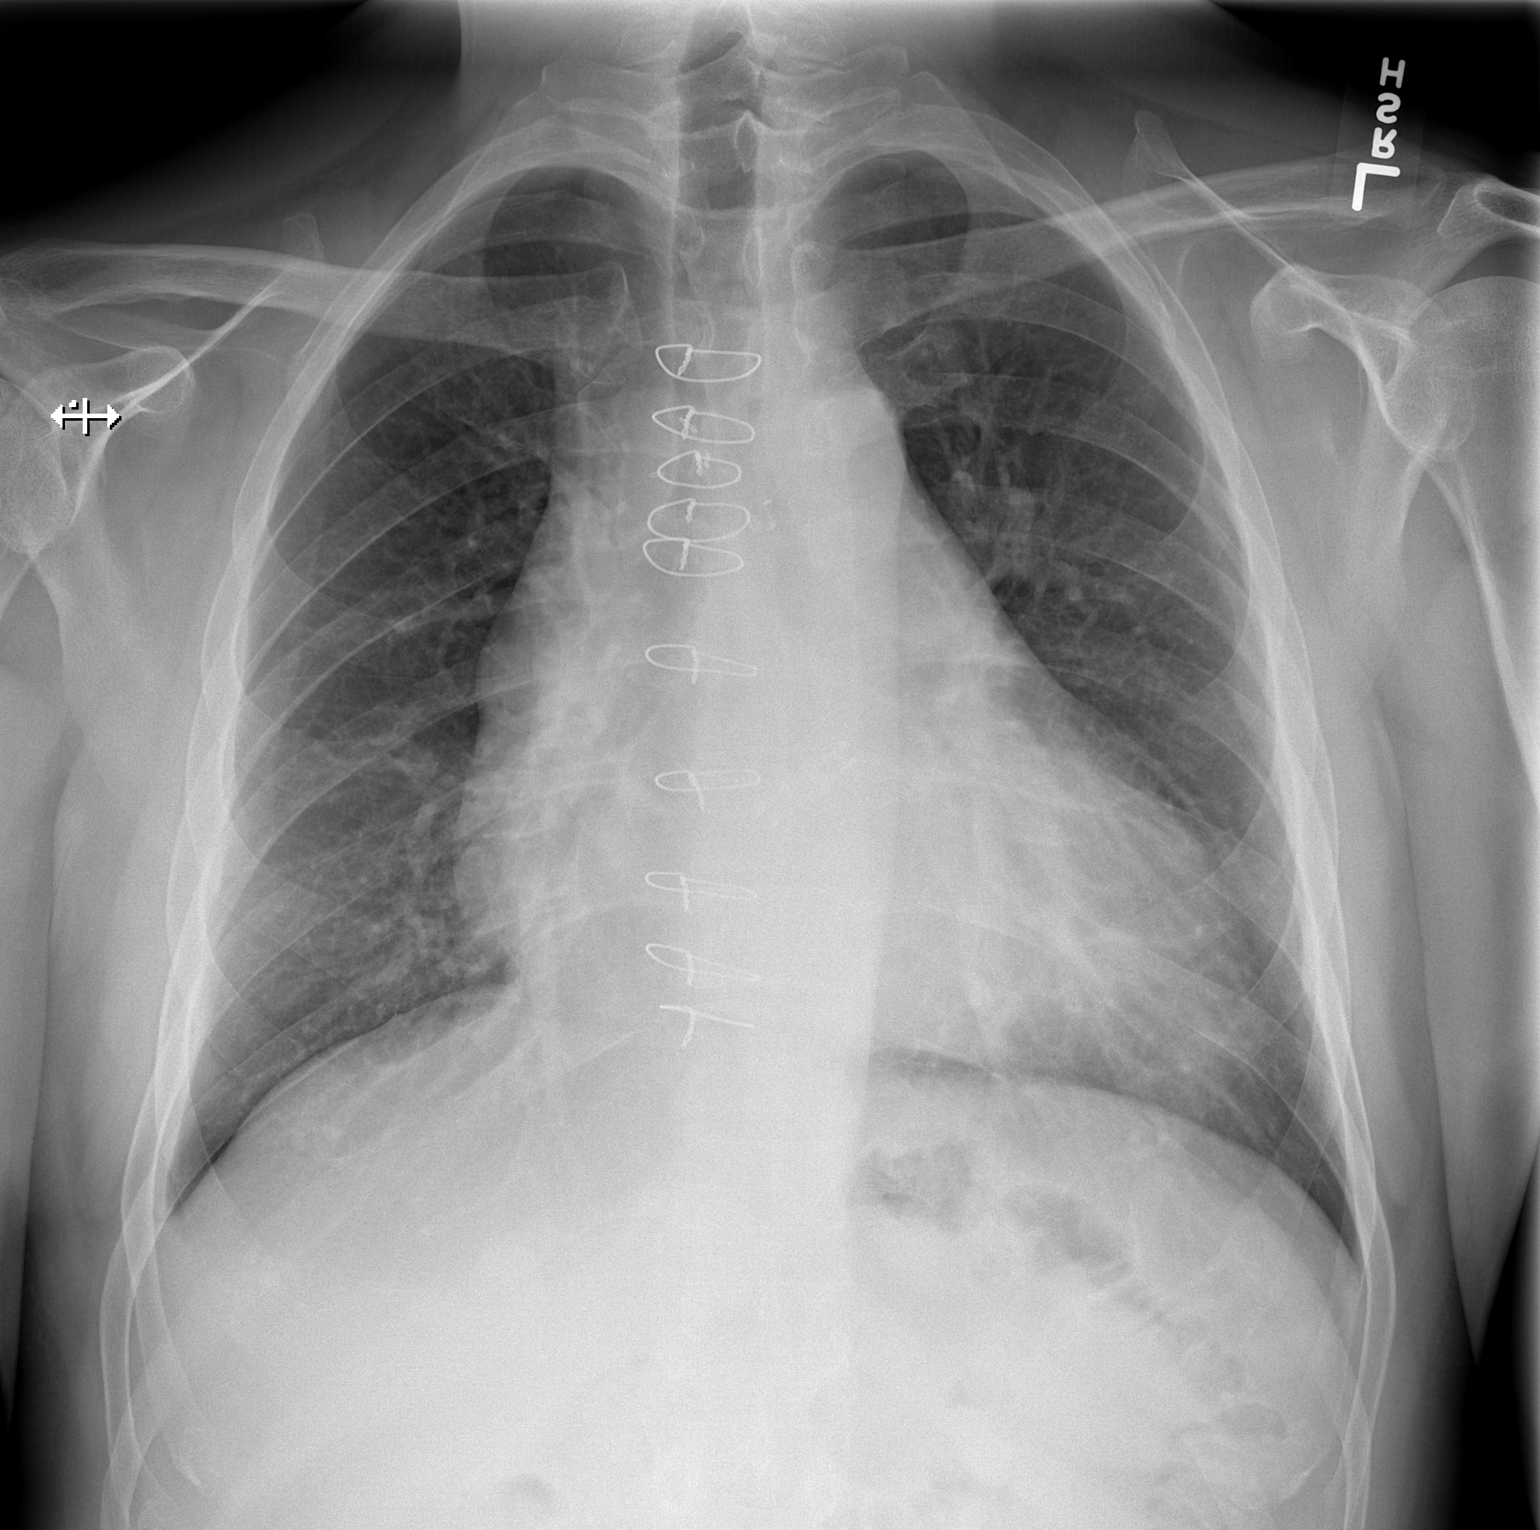

[w chest lat]
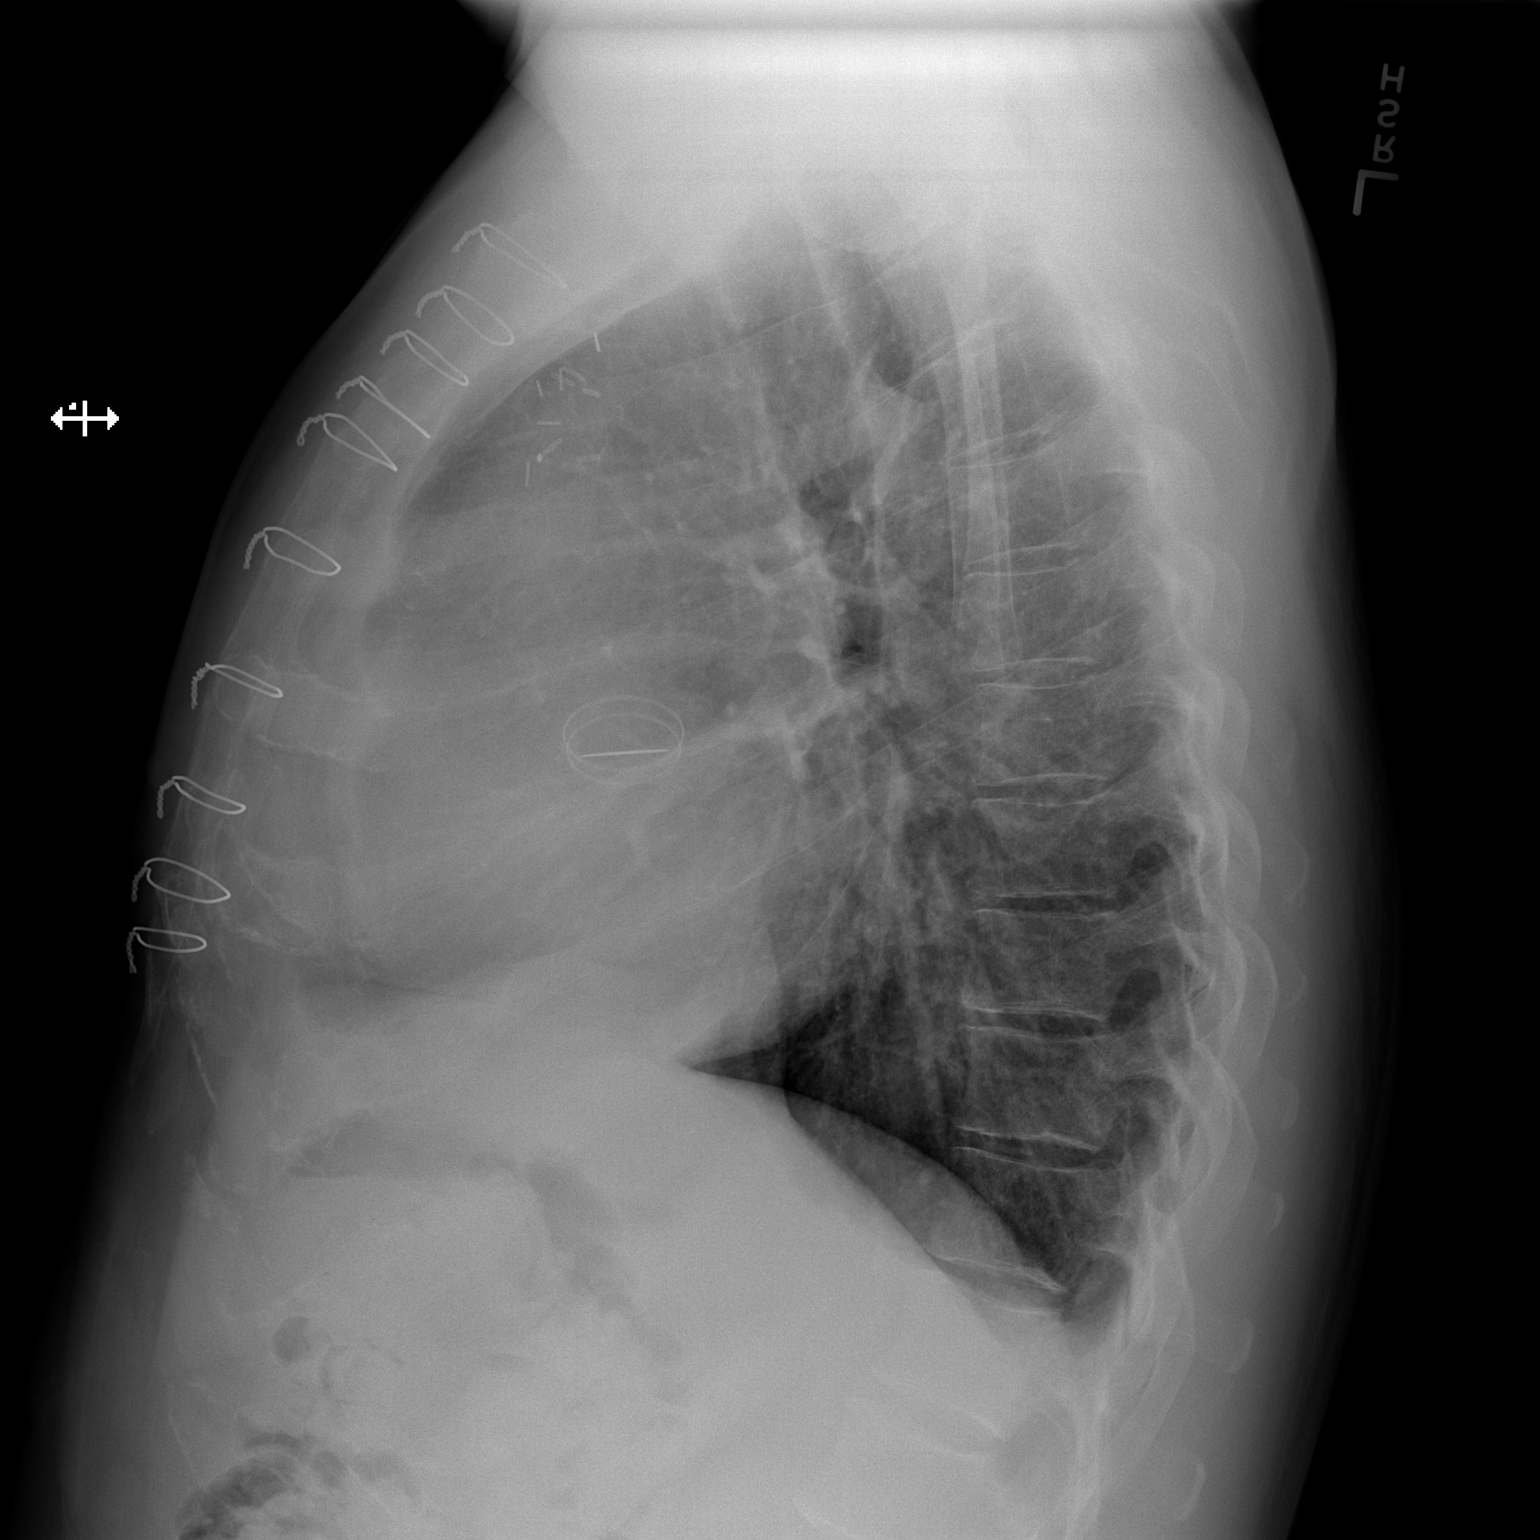

[2 of 2 positions shown; findings below may reference images not displayed]

FINDINGS: Cardiomegaly. Sequelae of sternotomy and prosthetic aortic valve.
Other mediastinal contours are within normal limits. Visualized
tracheal air column is within normal limits. Normal lung volumes.
Both lungs appear clear. No pneumothorax or pleural effusion. No
acute osseous abnormality identified. Negative visible bowel gas
pattern.
IMPRESSION: Cardiomegaly with prosthetic aortic valve. No acute cardiopulmonary
abnormality.

## 2020-02-14 ENCOUNTER — Encounter: Payer: Self-pay | Admitting: Neurology

## 2020-02-14 ENCOUNTER — Ambulatory Visit (INDEPENDENT_AMBULATORY_CARE_PROVIDER_SITE_OTHER): Payer: PRIVATE HEALTH INSURANCE | Admitting: Neurology

## 2020-02-14 VITALS — BP 115/72 | Ht 75.0 in | Wt 219.6 lb

## 2020-02-14 DIAGNOSIS — R413 Other amnesia: Secondary | ICD-10-CM

## 2020-02-14 DIAGNOSIS — G3184 Mild cognitive impairment, so stated: Secondary | ICD-10-CM | POA: Diagnosis not present

## 2020-02-14 NOTE — Progress Notes (Signed)
[facility] [address] [place]. [place] [address] [phone]       OFFICE FOLLOW UP VISIT NOTE  Mr. SNEYDER SPUHLER Date of Birth:  [birth year] Medical Record Number:  [id number]   Referring MD: Crist Infante  Reason for Referral: Memory loss and strokes [id number] visit 11/08/2019 Mr. Lackland is a pleasant 45 year old Caucasian male seen today for initial office consultation visit.  History is obtained from the patient and review of electronic medical records and have reviewed pertinent available imaging films in PACS.  He is a pleasant 45 year old male with complicated past cardiac history of bicuspid aortic valve with multiple surgeries. 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 AVRs were both at [facility].Development of severe AI in 2019 => he had a complicated procedure in 6/19 with failed initial TAVR with migration of valve followed by wall stent followed Medtronic Corevalve. CVAs: Peri-operatively with failed TAVR in 6/19.  TAVR valve noted to have severe peri-valvular regurgitation on 8/19 TEE.  - In 9/19, He had a 3rd sternotomy with removal of the 2 TAVR valves and wall stent, Bentall procedure with On-X mechanical aortic valve/28 mm Hemashield graft and reimplantation of the coronaries Patient states that he had significant left hemiparesis following his strokes in June 2019 and required prolonged period of rehabilitation and in fact went to [facility] in [place] where he was there for several months.  He is made good recovery and has regained most of his strength on the left side.  He does feel that fine motor skills are still diminished in the left hand and leg gets tired easily.  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  He does feel that his has poor short-term memory  and working memory is quite poor.  He has trouble remembering names.  Is not participating in any significant memory compensation strategies.  Memory difficulties are unchanged and they are not getting worse every day.  Is not tried any medications for his memory.  He has not had any recent neurovascular work-up or lipid profile A1c checked.  He has not had any lab work to look for reversible causes of memory loss either.  He denies any recurrent stroke or TIA symptoms.  He is living at home and is independent in his no current neurological complaints except memory difficulties Update 02/14/2020: He returns for follow-up after last visit 3 months ago.  He states is doing well has not had recurrent stroke or TIA symptoms.  He is tolerating Cerefolin NAC well and feels it may be helping somewhat.  He continues to have mild short-term memory cognitive difficulties but these are unchanged.  Is return back to his job and can manage with some difficulty he has trouble remembering names of clients and products at times but can still manage some help.  He did undergo lab work for reversible causes of memory loss at last visit all of which were normal.  Carotid ultrasound on 11/19/2019 showed no significant extracranial stenosis.  LDL cholesterol is elevated at 98 mg percent hemoglobin A1c was 5.5 on 11/08/2019.  He had MRI scan of the brain on 11/22/2019 which showed old left frontal subcortical stroke with no acute finding.  He has no new complaints today.  Patient did not undergo EEG for some reason.  He has an echocardiogram pending  next week. ROS:   14 system review of systems is positive for memory difficulties, short-term memory loss, fatiguability, tiredness all other systems negative  PMH:  Past Medical History:  Diagnosis Date  . Anxiety disorder   . 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 Bacterial endocarditis    age 69  . Bladder stones   . Cerebral infarction ([place])   . History of aortic valve  replacement 1997, 2009   . Hyperlipidemia   . Internal hemorrhoids   . Late, effect, cerebrovascular disease   . Memory loss   . Mild cognitive impairment   . Prostatitis 2010    Dr Merryl Hacker , [facility]  . Prostatitis   . Rosacea   . Stroke ([place])   . TIA (transient ischemic attack)     Social History:  Social History   Socioeconomic History  . Marital status: Married    Spouse name: Not on file  . Number of children: Not on file  . Years of education: Not on file  . Highest education level: Not on file  Occupational History  . Occupation: Full time  Tobacco Use  . Smoking status: Former Smoker    Start date: 01/07/1994    Quit date: 01/07/1994    Years since quitting: 26.1  . Smokeless tobacco: Never Used  . Tobacco comment: smoked 1990-1996, up to 1 pp week  Substance and Sexual Activity  . Alcohol use: No    Alcohol/week: 0.0 standard drinks    Comment:  quit 2009  . Drug use: No  . Sexual activity: Not on file  Other Topics Concern  . Not on file  Social History Narrative   Lives with wife and 2 children   Right Handed   Drinks 8-10 cups caffeine daily   Social Determinants of Health   Financial Resource Strain: Not on file  Food Insecurity: Not on file  Transportation Needs: Not on file  Physical Activity: Not on file  Stress: Not on file  Social Connections: Not on file  Intimate Partner Violence: Not on file    Medications:   Current Outpatient Medications on File Prior to Visit  Medication Sig Dispense Refill  . aspirin 81 MG tablet Take 1 tablet (81 mg total) by mouth daily. 30 tablet 3  . escitalopram (LEXAPRO) 10 MG tablet Take 20 mg by mouth daily.     . Melatonin 3 MG TABS Take 6 mg by mouth at bedtime.    . Methylfol-Methylcob-Acetylcyst (CEREFOLIN NAC) 6-2-600 MG TABS Take 1 tablet by mouth every morning. 90 tablet 3  . metoprolol succinate (TOPROL-XL) 25 MG 24 hr tablet TAKE 1 TABLET BY MOUTH TWICE DAILY(WITH MEALS OR IMMEDIATELY FOLLOWING ONE)  180 tablet 3  . mirtazapine (REMERON) 7.5 MG tablet Take 7.5 mg by mouth at bedtime.    . Rosuvastatin Calcium 10 MG CPSP Take 10 mg by mouth daily.     . Tamsulosin HCl (FLOMAX) 0.4 MG CAPS Take 0.4 mg by mouth daily.    . valsartan (DIOVAN) 40 MG tablet Take 1 tablet (40 mg total) by mouth daily. 30 tablet 6  . warfarin (COUMADIN) 10 MG tablet Take 1 tablet (10 mg total) by mouth daily. Take 1 tablet daily or as directed by the coumadin clinic 40 tablet 2   No current facility-administered medications on file prior to visit.    Allergies:   Allergies  Allergen Reactions  . Zocor [Simvastatin]     Muscle pain    Physical Exam  General: well developed, well nourished middle-aged Caucasian male, seated, in no evident distress Head: head normocephalic and atraumatic.   Neck: supple with no carotid or supraclavicular bruits Cardiovascular: regular rate and rhythm, no murmurs mechanical aortic valve sound. Musculoskeletal: no deformity Skin:  no rash/petichiae Vascular:  Normal pulses all extremities  Neurologic Exam Mental Status: Awake and fully alert. Oriented to place and time. Recent and remote memory intact. Attention span, concentration and fund of knowledge appropriate. Mood and affect appropriate.  Recall diminished 2/3.  Able to name 11 animals that can walk on 4 legs.  Able to copy intersecting pentagons well.  Clock drawing 4/4. Cranial Nerves: Fundoscopic exam not done pupils equal, briskly reactive to light. Extraocular movements full without nystagmus. Visual fields full to confrontation. Hearing intact. Facial sensation intact. Face, tongue, palate moves normally and symmetrically.  Motor: Normal bulk and tone. Normal strength in all tested extremity muscles.  Diminished fine finger movements on the left.  Orbits right over left upper extremity.  Foot tapping slightly diminished on the left compared to the right. Sensory.: intact to touch , pinprick , position and vibratory  sensation.  Coordination: Rapid alternating movements normal in all extremities. Finger-to-nose and heel-to-shin performed accurately bilaterally. Gait and Station: Arises from chair without difficulty. Stance is normal. Gait demonstrates normal stride length and balance . Able to heel, toe and tandem walk without difficulty.  Reflexes: 1+ and symmetric. Toes downgoing.       ASSESSMENT: 45 year old male with history of bicuspid aortic valve disorder status post multiple surgeries and perioperative strokes following open heart surgery in June 2019 with mild residual cognitive impairment but no major physical deficits.  He has mechanical heart valve and is now on long-term anticoagulation.    PLAN: I had a long discussion with the patient regarding his memory loss and mild cognitive impairment post strokes which appears to be stable.  I recommend he continue Cerefolin NAC daily as well as increase participation in cognitively challenging activities like solving crossword puzzles, playing bridge and sodoku.  He will continue on warfarin for stroke prevention for his mechanical heart valve and maintain aggressive risk factor modification with strict control of hypertension with blood pressure goal below 130/90, lipids with LDL cholesterol goal below 70 mg percent and diabetes with hemoglobin A1c goal below 6.5%.  We also discussed memory compensation strategies.  Return for follow-up in the future in 6 months or call earlier if necessary..  Greater than 50% time during this 25-minute   visit was spent on counseling and coordination of care about his remote perioperative strokes and residual mild cognitive impairment and answering questions Antony Contras, MD Note: This document was prepared with digital dictation and possible smart phrase technology. Any transcriptional errors that result from this process are unintentional.

## 2020-02-14 NOTE — Patient Instructions (Signed)
I had a long discussion with the patient regarding his memory loss and mild cognitive impairment post strokes which appears to be stable.  I recommend he continue Cerefolin NAC daily as well as increase participation in cognitively challenging activities like solving crossword puzzles, playing bridge and sodoku.  He will continue on warfarin for stroke prevention for his mechanical heart valve and maintain aggressive risk factor modification with strict control of hypertension with blood pressure goal below 130/90, lipids with LDL cholesterol goal below 70 mg percent and diabetes with hemoglobin A1c goal below 6.5%.  We also discussed memory compensation strategies.  Return for follow-up in the future in 6 months or call earlier if necessary. We have someMemory Compensation Strategies  1. Use "WARM" strategy.  W= write it down  A= associate it  R= repeat it  M= make a mental note  2.   You can keep a Social worker.  Use a 3-ring notebook with sections for the following: calendar, important names and phone numbers,  medications, doctors' names/phone numbers, lists/reminders, and a section to journal what you did  each day.   3.    Use a calendar to write appointments down.  4.    Write yourself a schedule for the day.  This can be placed on the calendar or in a separate section of the Memory Notebook.  Keeping a  regular schedule can help memory.  5.    Use medication organizer with sections for each day or morning/evening pills.  You may need help loading it  6.    Keep a basket, or pegboard by the door.  Place items that you need to take out with you in the basket or on the pegboard.  You may also want to  include a message board for reminders.  7.    Use sticky notes.  Place sticky notes with reminders in a place where the task is performed.  For example: " turn off the  stove" placed by the stove, "lock the door" placed on the door at eye level, " take your medications" on  the bathroom  mirror or by the place where you normally take your medications.  8.    Use alarms/timers.  Use while cooking to remind yourself to check on food or as a reminder to take your medicine, or as a  reminder to make a call, or as a reminder to perform another task, etc.

## 2020-02-15 ENCOUNTER — Ambulatory Visit (INDEPENDENT_AMBULATORY_CARE_PROVIDER_SITE_OTHER): Payer: PRIVATE HEALTH INSURANCE | Admitting: *Deleted

## 2020-02-15 ENCOUNTER — Other Ambulatory Visit: Payer: Self-pay

## 2020-02-15 DIAGNOSIS — Z7901 Long term (current) use of anticoagulants: Secondary | ICD-10-CM

## 2020-02-15 DIAGNOSIS — I359 Nonrheumatic aortic valve disorder, unspecified: Secondary | ICD-10-CM

## 2020-02-15 LAB — POCT INR: INR: 2 (ref 2.0–3.0)

## 2020-02-15 NOTE — Patient Instructions (Signed)
Description   Continue taking 1 tablet daily excpet for 1/2 tablet on Wednesdays and Saturdays. Recheck INR in 6 weeks. Call Coumadin Clinic [phone] with new medications, bleeding or questions.

## 2020-02-22 ENCOUNTER — Other Ambulatory Visit (HOSPITAL_COMMUNITY): Payer: Self-pay | Admitting: Cardiology

## 2020-02-28 ENCOUNTER — Other Ambulatory Visit: Payer: PRIVATE HEALTH INSURANCE

## 2020-02-29 ENCOUNTER — Ambulatory Visit (INDEPENDENT_AMBULATORY_CARE_PROVIDER_SITE_OTHER): Payer: PRIVATE HEALTH INSURANCE | Admitting: Neurology

## 2020-02-29 DIAGNOSIS — G3184 Mild cognitive impairment, so stated: Secondary | ICD-10-CM

## 2020-02-29 DIAGNOSIS — R410 Disorientation, unspecified: Secondary | ICD-10-CM | POA: Diagnosis not present

## 2020-02-29 DIAGNOSIS — R413 Other amnesia: Secondary | ICD-10-CM

## 2020-03-10 NOTE — Progress Notes (Signed)
Kindly inform the patient that EEG study was normal

## 2020-03-13 ENCOUNTER — Encounter: Payer: Self-pay | Admitting: *Deleted

## 2020-03-21 ENCOUNTER — Other Ambulatory Visit (HOSPITAL_COMMUNITY): Payer: Self-pay | Admitting: Cardiology

## 2020-03-30 ENCOUNTER — Ambulatory Visit (INDEPENDENT_AMBULATORY_CARE_PROVIDER_SITE_OTHER): Payer: PRIVATE HEALTH INSURANCE | Admitting: *Deleted

## 2020-03-30 ENCOUNTER — Other Ambulatory Visit: Payer: Self-pay

## 2020-03-30 DIAGNOSIS — Z7901 Long term (current) use of anticoagulants: Secondary | ICD-10-CM

## 2020-03-30 DIAGNOSIS — I359 Nonrheumatic aortic valve disorder, unspecified: Secondary | ICD-10-CM

## 2020-03-30 LAB — POCT INR: INR: 2.4 (ref 2.0–3.0)

## 2020-03-30 NOTE — Patient Instructions (Signed)
Description   Continue taking 1 tablet daily except for 1/2 tablet on Wednesdays and Saturdays. Recheck INR in 6 weeks. Call Coumadin Clinic [phone] with new medications, bleeding or questions.

## 2020-04-10 ENCOUNTER — Other Ambulatory Visit (HOSPITAL_COMMUNITY): Payer: Self-pay | Admitting: Adult Health

## 2020-04-10 ENCOUNTER — Other Ambulatory Visit: Payer: Self-pay

## 2020-04-10 ENCOUNTER — Ambulatory Visit (HOSPITAL_COMMUNITY)
Admission: RE | Admit: 2020-04-10 | Discharge: 2020-04-10 | Disposition: A | Discharge status: Home / Self Care | Payer: PRIVATE HEALTH INSURANCE | Source: Ambulatory Visit | Attending: Cardiology | Admitting: Cardiology

## 2020-04-10 DIAGNOSIS — M7989 Other specified soft tissue disorders: Secondary | ICD-10-CM

## 2020-04-11 ENCOUNTER — Ambulatory Visit (HOSPITAL_BASED_OUTPATIENT_CLINIC_OR_DEPARTMENT_OTHER)
Admission: RE | Admit: 2020-04-11 | Discharge: 2020-04-11 | Disposition: A | Discharge status: Home / Self Care | Payer: PRIVATE HEALTH INSURANCE | Source: Ambulatory Visit | Attending: Cardiology | Admitting: Cardiology

## 2020-04-11 ENCOUNTER — Encounter (HOSPITAL_COMMUNITY): Payer: Self-pay | Admitting: Cardiology

## 2020-04-11 ENCOUNTER — Ambulatory Visit (HOSPITAL_COMMUNITY)
Admission: RE | Admit: 2020-04-11 | Discharge: 2020-04-11 | Disposition: A | Discharge status: Home / Self Care | Payer: PRIVATE HEALTH INSURANCE | Source: Ambulatory Visit | Attending: Internal Medicine | Admitting: Internal Medicine

## 2020-04-11 ENCOUNTER — Other Ambulatory Visit (HOSPITAL_COMMUNITY): Payer: Self-pay

## 2020-04-11 VITALS — BP 100/60 | HR 49 | Wt 219.2 lb

## 2020-04-11 DIAGNOSIS — Z7982 Long term (current) use of aspirin: Secondary | ICD-10-CM | POA: Diagnosis not present

## 2020-04-11 DIAGNOSIS — I5022 Chronic systolic (congestive) heart failure: Secondary | ICD-10-CM | POA: Diagnosis not present

## 2020-04-11 DIAGNOSIS — I359 Nonrheumatic aortic valve disorder, unspecified: Secondary | ICD-10-CM | POA: Diagnosis not present

## 2020-04-11 DIAGNOSIS — Z8249 Family history of ischemic heart disease and other diseases of the circulatory system: Secondary | ICD-10-CM | POA: Insufficient documentation

## 2020-04-11 DIAGNOSIS — Z952 Presence of prosthetic heart valve: Secondary | ICD-10-CM | POA: Diagnosis not present

## 2020-04-11 DIAGNOSIS — Z7901 Long term (current) use of anticoagulants: Secondary | ICD-10-CM | POA: Insufficient documentation

## 2020-04-11 DIAGNOSIS — Z8673 Personal history of transient ischemic attack (TIA), and cerebral infarction without residual deficits: Secondary | ICD-10-CM | POA: Insufficient documentation

## 2020-04-11 DIAGNOSIS — Z8774 Personal history of (corrected) congenital malformations of heart and circulatory system: Secondary | ICD-10-CM | POA: Diagnosis not present

## 2020-04-11 DIAGNOSIS — Z79899 Other long term (current) drug therapy: Secondary | ICD-10-CM | POA: Diagnosis not present

## 2020-04-11 DIAGNOSIS — E785 Hyperlipidemia, unspecified: Secondary | ICD-10-CM | POA: Insufficient documentation

## 2020-04-11 HISTORY — DX: Heart failure, unspecified: I50.9

## 2020-04-11 LAB — BASIC METABOLIC PANEL
Anion gap: 7 (ref 5–15)
BUN: 15 mg/dL (ref 6–20)
CO2: 26 mmol/L (ref 22–32)
Calcium: 9.4 mg/dL (ref 8.9–10.3)
Chloride: 103 mmol/L (ref 98–111)
Creatinine, Ser: 1.15 mg/dL (ref 0.61–1.24)
GFR, Estimated: >60 mL/min (ref >60)
Glucose, Bld: 88 mg/dL (ref 70–99)
Potassium: 4.4 mmol/L (ref 3.5–5.1)
Sodium: 136 mmol/L (ref 135–145)

## 2020-04-11 LAB — ECHOCARDIOGRAM COMPLETE
AR max vel: 2.94 cm2
AV Area VTI: 3.26 cm2
AV Area mean vel: 2.98 cm2
AV Mean grad: 7.8 mmHg
AV Peak grad: 12.5 mmHg
Ao pk vel: 1.77 m/s
Area-P 1/2: 1.92 cm2
Calc EF: 48.2 %
S' Lateral: 4.1 cm
Single Plane A2C EF: 53.7 %
Single Plane A4C EF: 44.2 %

## 2020-04-11 MED ORDER — ENTRESTO 24-26 MG PO TABS: 1.0000 | ORAL_TABLET | Freq: Two times a day (BID) | ORAL | 3 refills | Status: DC

## 2020-04-11 MED ORDER — METOPROLOL SUCCINATE ER 25 MG PO TB24
25.0000 mg | ORAL_TABLET | Freq: Every day | ORAL | 3 refills | Status: DC
Start: 2020-04-11 — End: 2021-03-19

## 2020-04-11 NOTE — Progress Notes (Signed)
Patient ID: Corey Rose, male   DOB: Mar 17, 1975, 45 y.o.   MRN: [id number] PCP: Dr. Joylene Draft Cardiology: Dr. Aundra Dubin  45 y.o.with history of bicuspid valve disorder s/p AVR x 3 presents for followup of aortic valve disorder.  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  He had a homograft aortic valve placed at that time.  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  He had repeat AVR with a Freestyle Medtronic bioprosthetic valve in 2009. Both initial AVRs were at [facility] (Dr. Clementeen Graham).    In 2019, he was noted to have a louder diastolic murmur and significant aortic insufficiency was noted.  He was seen again at [facility], and decision was made to undergo TAVR.  Initial TAVR valve was undersized and migrated.  A wall stent was used to crush this valve against the wall of the aortic root, leading to severe AI.  The patient developed flash pulmonary edema and was put on ECMO for several days. After wall stent was placed, a Medtronic Corevalve was deployed in the aortic position.  The patient was taken off ECMO.  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 He underwent extensive rehab at the [facility] in [place] and seems to have recovered nearly fully from strokes.   Repeat TEE in 8/19 showed severe peri-valvular aortic insufficiency around the Medtronic Corevalve prosthesis with preserved EF.   Patient was then seen at the [facility].  He had a 3rd sternotomy with removal of the 2 TAVR valves and wall stent, Bentall procedure with On-X mechanical aortic valve/28 mm Hemashield graft and reimplantation of the coronaries.    Echo in 9/19 showed EF 50-55% (low normal to mildly decreased), mildly dilated RV with mildly decreased systolic function, normal mechanical aortic valve.  Repeat echo in 6/20 showed stable EF 50-55%, mild LV dilation, mild RV dilation with normal systolic function, normal  mechanical aortic valve.   Echo was done today and reviewed, EF 50% with mild LV dilation, normal RV, On-X mechanical aortic valve functions normally.   He returns today for followup of mechanical AVR.  He continues to do very well overall.  No significant exertional dyspnea.  He can jog without problems.  Rare mild lightheadedness if he stands up too fast.  Weight down 7 lbs compared to last appt.   ECG (personally reviewed): Sinus brady 50 bpm  Labs (12/13): K 4.5, creatinine 0.9, HDL 41, LDL 194, TSH normal Labs (3/16): LDL 111, HDL 44  Labs (9/19): K 4.2, creatinine 0.94, hgb 10.2 Labs (1/20): LDL 81 Labs (6/20): K 4.1, creatinine 1.18 Labs (11/21): LDL 98, HDL 54  PMH: 1. Hyperlipidemia: Myalgias with simvastatin. 2. 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 AVRs were both at [facility], Dr. Clementeen Graham.  Echo (1/14) with EF 55-60%, bioprosthetic aortic valve with mean gradient 5 mmHg.  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  Echo (3/16) with EF 55-60%, bioprosthetic aortic valve with mean gradient 4 mmHg and trivial AI, normal RV size and systolic function.  - Echo (4/18): EF 60-65%, well-seated bioprosthetic aortic valve with no stenosis, mild regurgitation.  - MRA chest (1/17) with 4.3 cm ascending aorta.  - Development of severe AI in 2019 =>  he had a complicated procedure in 6/19 with failed initial TAVR with migration of valve followed by wall stent followed Medtronic Corevalve.   - TAVR valve noted to have severe peri-valvular regurgitation on 8/19 TEE.  - In 9/19, He had a 3rd sternotomy with removal of the 2 TAVR valves and wall stent, Bentall procedure with On-X mechanical aortic valve/28 mm Hemashield graft and reimplantation of the coronaries.  - Echo (9/19) showed EF  50-55% (low normal to mildly decreased), mildly dilated RV with mildly decreased systolic function, normal mechanical aortic valve.  - Echo (6/20) showed stable EF 50-55%, mild LV dilation, mild RV dilation with normal systolic function, normal mechanical aortic valve (On-X).   - Echo (3/22): EF 50% with mild LV dilation, normal RV, On-X mechanical aortic valve functions normally.  3. H/o prostatitis 4. H/o bladder stones 5. CVAs: Peri-operatively with failed TAVR in 6/19.  6. PVCs  SH: Married, 2 children, lives in [place].  Owns [employer]   FH: Father with atrial fibrillation, grandfather with "heart disease."   ROS: All systems reviewed and negative except as per HPI.   Current Outpatient Medications  Medication Sig Dispense Refill  . aspirin 81 MG tablet Take 1 tablet (81 mg total) by mouth daily. 30 tablet 3  . escitalopram (LEXAPRO) 10 MG tablet Take 20 mg by mouth daily.     . Melatonin 3 MG TABS Take 6 mg by mouth at bedtime.    . Methylfol-Methylcob-Acetylcyst (CEREFOLIN NAC) 6-2-600 MG TABS Take 1 tablet by mouth every morning. 90 tablet 3  . mirtazapine (REMERON) 7.5 MG tablet Take 7.5 mg by mouth at bedtime.    . Rosuvastatin Calcium 10 MG CPSP Take 10 mg by mouth daily.     . sacubitril-valsartan (ENTRESTO) 24-26 MG Take 1 tablet by mouth 2 (two) times daily. 180 tablet 3  . Tamsulosin HCl (FLOMAX) 0.4 MG CAPS Take 0.4 mg by mouth daily.    Marland Kitchen warfarin (COUMADIN) 10 MG tablet TAKE ONE TABLET DAILY OR TAKE AS DIRECTED BY COUMADIN CLINIC 40 tablet 2  . metoprolol succinate (TOPROL-XL) 25 MG 24 hr tablet Take 1 tablet (25 mg total) by mouth at bedtime. 90 tablet 3   No current facility-administered medications for this encounter.   BP 100/60   Pulse (!) 49   Wt 99.4 kg (219 lb 3.2 oz)   SpO2 97%   BMI 27.40 kg/m  General: NAD Neck: No JVD, no thyromegaly or thyroid nodule.  Lungs: Clear to auscultation bilaterally with normal respiratory effort. CV: Nondisplaced PMI.   Heart regular S1/S2 with mechanical S2, no S3/S4, no murmur.  No peripheral edema.  No carotid bruit.  Normal pedal pulses.  Abdomen: Soft, nontender, no hepatosplenomegaly, no distention.  Skin: Intact without lesions or rashes.  Neurologic: Alert and oriented x 3.  Psych: Normal affect. Extremities: No clubbing or cyanosis.  HEENT: Normal.    Assessment/Plan: 1. Bicuspid aortic valve disorder: Patient had bicuspid aortic valve and developed endocarditis.  He had AVR initially in 1997 then developed a pseudoaneurysm of the aortic root and repeat AVR with a bioprosthetic valve in 2009.  2019 developed severe AI, had failed TAVR procedure in 6/19 followed by Bentall procedure with On-X mechanical aortic valve/28 mm Hemashield graft and coronary reimplantation in 9/19.  The valve is stable on echo today.   - Needs antibiotic prophylaxis with dental procedures.  - Patient has an On-X mechanical valve and is now out > 3 months from surgery. Based on  data from PROACT trial, he could decrease INR goal to 1.5-2 long-term. He is considering entering the PROACT Xa trial through the [facility] => apixaban versus warfarin with On-X valve.  If he does not enter this trial, could lower INR goal to 1.5-2.  - Continue ASA 81 daily.   - He will need a CTA to assess ascending aorta surgical site, order today.  - CBC today.  2. Hyperlipidemia: LDL < 100 in 11/21, continue Crestor.   3. CVAs: Peri-operatively with failed TAVR.  He seems to have recovered back to his baseline.  4. PVCs: No palpitations recently.  5. Chronic HF with mid-range EF: Echo today was similar to priors, with mildly dilated LV and EF 50%.   - With low HR and to allow BP room for Entresto, decrease Toprol XL to 25 mg qhs.  - I will have him stop valsartan and start Entresto 24/26 bid. If BP drops too much, he can switch back to valsartan. I asked him to stay well-hydrated.  BMET today and in 10 days.   Followup in 6 months.    Loralie Champagne 04/11/2020

## 2020-04-11 NOTE — Progress Notes (Signed)
  Echocardiogram 2D Echocardiogram has been performed.  Corey Rose 04/11/2020, 12:03 PM

## 2020-04-11 NOTE — Patient Instructions (Signed)
Stop Valsartan  Start Entresto 24/26 mg Twice daily   Decrease Metoprolol to 25 mg Daily at bedtime  Make sure to stay hydrated while taking Entresto  Labs done today, your results will be available in MyChart, we will contact you for abnormal readings.  Your physician recommends that you return for lab work in: 2 weeks  Non-Cardiac CT Angiography (CTA), is a special type of CT scan that uses a computer to produce multi-dimensional views of major blood vessels throughout the body. In CT angiography, a contrast material is injected through an IV to help visualize the blood vessels. Once approved by your insurance company we will call you to schedule this  Please call our office in September to schedule your follow up appointment  If you have any questions or concerns before your next appointment please send [place] a message through [name] or call our office at [phone].    TO LEAVE A MESSAGE FOR THE NURSE SELECT OPTION 2, PLEASE LEAVE A MESSAGE INCLUDING: . YOUR NAME . DATE OF BIRTH . CALL BACK NUMBER . REASON FOR CALL**this is important as we prioritize the call backs  [place] AS LONG AS YOU CALL BEFORE 4:00 PM  At the [facility], you and your health needs are our priority. As part of our continuing mission to provide you with exceptional heart care, we have created designated Provider Care Teams. These Care Teams include your primary Cardiologist (physician) and Advanced Practice Providers (APPs- Physician Assistants and Nurse Practitioners) who all work together to provide you with the care you need, when you need it.   You may see any of the following providers on your designated Care Team at your next follow up: Marland Kitchen Dr Glori Bickers . Dr Loralie Champagne . Dr Vickki Muff . Darrick Grinder, NP . Lyda Jester, [place] . Audry Riles, PharmD   Please be sure to bring in all your medications bottles to every  appointment.

## 2020-04-11 NOTE — Progress Notes (Signed)
Pt given 30 day free card and copay card for [facility]

## 2020-04-17 ENCOUNTER — Other Ambulatory Visit (HOSPITAL_COMMUNITY): Payer: Self-pay | Admitting: Cardiology

## 2020-04-18 ENCOUNTER — Other Ambulatory Visit (HOSPITAL_COMMUNITY): Payer: Self-pay

## 2020-04-24 ENCOUNTER — Ambulatory Visit (HOSPITAL_COMMUNITY)
Admission: RE | Admit: 2020-04-24 | Discharge: 2020-04-24 | Disposition: A | Discharge status: Home / Self Care | Payer: PRIVATE HEALTH INSURANCE | Source: Ambulatory Visit | Attending: Cardiology | Admitting: Cardiology

## 2020-04-24 ENCOUNTER — Other Ambulatory Visit: Payer: Self-pay

## 2020-04-24 DIAGNOSIS — Z7901 Long term (current) use of anticoagulants: Secondary | ICD-10-CM

## 2020-04-24 DIAGNOSIS — I359 Nonrheumatic aortic valve disorder, unspecified: Secondary | ICD-10-CM

## 2020-04-24 LAB — BASIC METABOLIC PANEL
Anion gap: 4 — ABNORMAL LOW (ref 5–15)
BUN: 10 mg/dL (ref 6–20)
CO2: 28 mmol/L (ref 22–32)
Calcium: 9.4 mg/dL (ref 8.9–10.3)
Chloride: 104 mmol/L (ref 98–111)
Creatinine, Ser: 1.11 mg/dL (ref 0.61–1.24)
GFR, Estimated: >60 mL/min (ref >60)
Glucose, Bld: 93 mg/dL (ref 70–99)
Potassium: 4.5 mmol/L (ref 3.5–5.1)
Sodium: 136 mmol/L (ref 135–145)

## 2020-04-26 ENCOUNTER — Other Ambulatory Visit (HOSPITAL_COMMUNITY): Payer: PRIVATE HEALTH INSURANCE

## 2020-05-11 ENCOUNTER — Other Ambulatory Visit: Payer: Self-pay

## 2020-05-11 ENCOUNTER — Ambulatory Visit (INDEPENDENT_AMBULATORY_CARE_PROVIDER_SITE_OTHER): Payer: PRIVATE HEALTH INSURANCE | Admitting: *Deleted

## 2020-05-11 ENCOUNTER — Telehealth: Payer: Self-pay | Admitting: *Deleted

## 2020-05-11 DIAGNOSIS — Z7901 Long term (current) use of anticoagulants: Secondary | ICD-10-CM

## 2020-05-11 DIAGNOSIS — Z5181 Encounter for therapeutic drug level monitoring: Secondary | ICD-10-CM

## 2020-05-11 DIAGNOSIS — I359 Nonrheumatic aortic valve disorder, unspecified: Secondary | ICD-10-CM

## 2020-05-11 LAB — POCT INR: INR: 2 (ref 2.0–3.0)

## 2020-05-11 NOTE — Telephone Encounter (Signed)
Saw pt today at anti-coag visit. Per Dr. Claris Gladden note from 04/11/2020, pt's INR goal can be decreased to 1.5-2.0. Spoke to [place]. Will reach out to [facility] where pt is in a trail to make sure they are okay with INR goal being 1.5-2 and then will confirm with Dr Aundra Dubin. Called Burnett Harry from [facility] phone number # [phone].

## 2020-05-11 NOTE — Patient Instructions (Addendum)
Description   Continue taking 1 tablet daily except for 1/2 tablet on Wednesdays and Saturdays. Recheck INR on 5/9, since you are on doxy. Eat an extra serving of greens over the weekend. Usually check INR every 6 weeks. Call Coumadin Clinic [phone] with new medications, bleeding or questions.

## 2020-05-11 NOTE — Telephone Encounter (Signed)
Attempted to call Raquel Sarna again at [phone], unable to [facility] but it forwarded me to another phone number. Spoke to someone from the [facility] and they stated that they would forward me to [place] at [phone]. Got in touch with Emily's voicemail, LMOM for her to call the coumadin clinic back.

## 2020-05-11 NOTE — Telephone Encounter (Signed)
Called pt and LMOM.  Pt has an appointment on Monday to have INR checked. Attempting to verify goal by Monday. Have not been able to get in touch with [facility] after multiple attempts.  Calling pt to see if he had a direct number to get in touch with research study and to make him aware that we were in the process of trying to verify his INR goal for his appointment on Monday.

## 2020-05-11 NOTE — Telephone Encounter (Signed)
Attempted to call Raquel Sarna at [phone]- no answer.

## 2020-05-11 NOTE — Telephone Encounter (Signed)
  Per Dr. Claris Gladden note from 04/11/2020  - Patient has an On-X mechanical valve and is now out > 3 months from surgery. Based on data from PROACT trial, he could decrease INR goal to 1.5-2 long-term. He is considering entering the PROACT Xa trial through the [facility] => apixaban versus warfarin with On-X valve.  If he does not enter this trial, could lower INR goal to 1.5-2.

## 2020-05-12 NOTE — Telephone Encounter (Signed)
Received call back from [facility] with [facility]. She states that pt's INR range has to remain as is while he's in the study - we cannot decrease it to 1.5-2. Noted and will continue managing warfarin with current INR goal 2-2.5.

## 2020-05-15 ENCOUNTER — Ambulatory Visit (INDEPENDENT_AMBULATORY_CARE_PROVIDER_SITE_OTHER): Payer: PRIVATE HEALTH INSURANCE | Admitting: *Deleted

## 2020-05-15 ENCOUNTER — Other Ambulatory Visit: Payer: Self-pay

## 2020-05-15 DIAGNOSIS — Z7901 Long term (current) use of anticoagulants: Secondary | ICD-10-CM | POA: Diagnosis not present

## 2020-05-15 DIAGNOSIS — I359 Nonrheumatic aortic valve disorder, unspecified: Secondary | ICD-10-CM

## 2020-05-15 LAB — POCT INR: INR: 2.4 (ref 2.0–3.0)

## 2020-05-15 NOTE — Patient Instructions (Signed)
Description   Continue taking 1 tablet daily except for 1/2 tablet on Wednesdays and Saturdays. Recheck INR 6 weeks. Call Coumadin Clinic [phone] with new medications, bleeding or questions.

## 2020-05-25 ENCOUNTER — Telehealth (HOSPITAL_COMMUNITY): Payer: Self-pay | Admitting: *Deleted

## 2020-05-25 ENCOUNTER — Other Ambulatory Visit (HOSPITAL_COMMUNITY): Payer: Self-pay | Admitting: *Deleted

## 2020-05-25 MED ORDER — NIRMATRELVIR/RITONAVIR (PAXLOVID)TABLET: 3.0000 | ORAL_TABLET | Freq: Two times a day (BID) | ORAL | 0 refills | Status: AC

## 2020-05-25 NOTE — Telephone Encounter (Signed)
Received call from Corey Rose to send paxlovid in for pt but to check with pharmacy staff to see if medication interacts with pts current medications. Per Thurmond Butts paxlovid is contraindicated with his flomax. Dr.McLean spoke with pt and he will hold flomax for 5 days while taking paxlovid. [facility].

## 2020-05-29 ENCOUNTER — Other Ambulatory Visit: Payer: Self-pay | Admitting: Surgery

## 2020-05-29 DIAGNOSIS — R599 Enlarged lymph nodes, unspecified: Secondary | ICD-10-CM

## 2020-06-28 ENCOUNTER — Ambulatory Visit (INDEPENDENT_AMBULATORY_CARE_PROVIDER_SITE_OTHER): Payer: No Typology Code available for payment source | Admitting: Pharmacist

## 2020-06-28 ENCOUNTER — Other Ambulatory Visit: Payer: Self-pay

## 2020-06-28 DIAGNOSIS — Z7901 Long term (current) use of anticoagulants: Secondary | ICD-10-CM

## 2020-06-28 DIAGNOSIS — I639 Cerebral infarction, unspecified: Secondary | ICD-10-CM

## 2020-06-28 DIAGNOSIS — I359 Nonrheumatic aortic valve disorder, unspecified: Secondary | ICD-10-CM | POA: Diagnosis not present

## 2020-06-28 LAB — POCT INR: INR: 2.5 (ref 2.0–3.0)

## 2020-06-28 NOTE — Patient Instructions (Signed)
Description   Continue taking 1 tablet daily except for 1/2 tablet on Wednesdays and Saturdays. Recheck INR 6 weeks. Call Coumadin Clinic [phone] with new medications, bleeding or questions.

## 2020-06-29 ENCOUNTER — Ambulatory Visit
Admission: RE | Admit: 2020-06-29 | Discharge: 2020-06-29 | Disposition: A | Discharge status: Home / Self Care | Payer: No Typology Code available for payment source | Source: Ambulatory Visit | Attending: Surgery | Admitting: Surgery

## 2020-06-29 ENCOUNTER — Ambulatory Visit
Admission: RE | Admit: 2020-06-29 | Discharge: 2020-06-29 | Disposition: A | Discharge status: Home / Self Care | Payer: No Typology Code available for payment source | Source: Ambulatory Visit | Attending: Cardiology | Admitting: Cardiology

## 2020-06-29 DIAGNOSIS — I359 Nonrheumatic aortic valve disorder, unspecified: Secondary | ICD-10-CM

## 2020-06-29 DIAGNOSIS — R599 Enlarged lymph nodes, unspecified: Secondary | ICD-10-CM

## 2020-06-29 MED ORDER — IOPAMIDOL (ISOVUE-370) INJECTION 76%
75.0000 mL | Freq: Once | INTRAVENOUS | Status: AC | PRN
  Administered 2020-06-29: 75 mL via INTRAVENOUS

## 2020-07-04 ENCOUNTER — Telehealth (HOSPITAL_COMMUNITY): Payer: Self-pay | Admitting: *Deleted

## 2020-08-09 ENCOUNTER — Ambulatory Visit (INDEPENDENT_AMBULATORY_CARE_PROVIDER_SITE_OTHER): Payer: No Typology Code available for payment source

## 2020-08-09 ENCOUNTER — Other Ambulatory Visit: Payer: Self-pay

## 2020-08-09 DIAGNOSIS — I359 Nonrheumatic aortic valve disorder, unspecified: Secondary | ICD-10-CM | POA: Diagnosis not present

## 2020-08-09 DIAGNOSIS — Z7901 Long term (current) use of anticoagulants: Secondary | ICD-10-CM | POA: Diagnosis not present

## 2020-08-09 LAB — POCT INR: INR: 2.4 (ref 2.0–3.0)

## 2020-08-09 NOTE — Patient Instructions (Signed)
Description   Continue taking 1 tablet daily except for 1/2 tablet on Wednesdays and Saturdays. Recheck INR 6 weeks. Call Coumadin Clinic [phone] with new medications, bleeding or questions.

## 2020-08-29 ENCOUNTER — Ambulatory Visit: Payer: PRIVATE HEALTH INSURANCE | Admitting: Neurology

## 2020-09-20 ENCOUNTER — Ambulatory Visit (INDEPENDENT_AMBULATORY_CARE_PROVIDER_SITE_OTHER): Payer: No Typology Code available for payment source

## 2020-09-20 ENCOUNTER — Other Ambulatory Visit: Payer: Self-pay

## 2020-09-20 DIAGNOSIS — I359 Nonrheumatic aortic valve disorder, unspecified: Secondary | ICD-10-CM

## 2020-09-20 DIAGNOSIS — Z7901 Long term (current) use of anticoagulants: Secondary | ICD-10-CM | POA: Diagnosis not present

## 2020-09-20 LAB — POCT INR: INR: 2.8 (ref 2.0–3.0)

## 2020-09-20 NOTE — Patient Instructions (Signed)
Description   Continue taking 1 tablet daily except for 1/2 tablet on Wednesdays and Saturdays. Eat something green and leafy today.  Recheck INR 6 weeks. Call Coumadin Clinic [phone] with new medications, bleeding or questions.

## 2020-10-26 ENCOUNTER — Telehealth: Payer: Self-pay | Admitting: Neurology

## 2020-10-26 MED ORDER — CEREFOLIN NAC 6-2-600 MG PO TABS
1.0000 | ORAL_TABLET | Freq: Every morning | ORAL | 0 refills | Status: DC
Start: 2020-10-26 — End: 2021-01-29

## 2020-10-26 NOTE — Telephone Encounter (Signed)
Pt request refill for Methylfol-Methylcob-Acetylcyst (CEREFOLIN NAC) 6-2-600 MG TABS  at [facility]

## 2020-10-26 NOTE — Telephone Encounter (Signed)
90-day supply sent to pharmacy for patient.  He was last seen 02/14/20. Follow up needed to be in six months. Our office will reach out to get him scheduled.

## 2020-10-30 NOTE — Telephone Encounter (Signed)
LVM for patient to call the office to schedule an appt.

## 2020-10-31 NOTE — Telephone Encounter (Signed)
Pt returned call, scheduled appt for 02/28/2021.

## 2020-11-03 ENCOUNTER — Other Ambulatory Visit: Payer: Self-pay

## 2020-11-03 ENCOUNTER — Ambulatory Visit (INDEPENDENT_AMBULATORY_CARE_PROVIDER_SITE_OTHER): Payer: No Typology Code available for payment source | Admitting: *Deleted

## 2020-11-03 DIAGNOSIS — I359 Nonrheumatic aortic valve disorder, unspecified: Secondary | ICD-10-CM | POA: Diagnosis not present

## 2020-11-03 DIAGNOSIS — Z7901 Long term (current) use of anticoagulants: Secondary | ICD-10-CM | POA: Diagnosis not present

## 2020-11-03 LAB — POCT INR: INR: 2.7 (ref 2.0–3.0)

## 2020-11-03 NOTE — Patient Instructions (Signed)
Description   Increase green intake then continue taking 1 tablet daily except for 1/2 tablet on Wednesdays and Saturdays. Eat something green and leafy today & remain consistent.  Recheck INR 5 weeks. Call Coumadin Clinic [phone] with new medications, bleeding or questions.

## 2020-11-15 ENCOUNTER — Other Ambulatory Visit (HOSPITAL_COMMUNITY): Payer: Self-pay | Admitting: *Deleted

## 2020-11-15 MED ORDER — WARFARIN SODIUM 5 MG PO TABS: ORAL_TABLET | ORAL | 11 refills | Status: DC

## 2020-12-08 ENCOUNTER — Other Ambulatory Visit: Payer: Self-pay

## 2020-12-08 ENCOUNTER — Ambulatory Visit (INDEPENDENT_AMBULATORY_CARE_PROVIDER_SITE_OTHER): Payer: No Typology Code available for payment source

## 2020-12-08 DIAGNOSIS — I359 Nonrheumatic aortic valve disorder, unspecified: Secondary | ICD-10-CM

## 2020-12-08 DIAGNOSIS — Z5181 Encounter for therapeutic drug level monitoring: Secondary | ICD-10-CM

## 2020-12-08 DIAGNOSIS — Z7901 Long term (current) use of anticoagulants: Secondary | ICD-10-CM | POA: Diagnosis not present

## 2020-12-08 LAB — POCT INR: INR: 2.2 (ref 2.0–3.0)

## 2020-12-08 NOTE — Patient Instructions (Signed)
Description   Continue taking 1 tablet daily except for 1/2 tablet on Wednesdays and Saturdays. Eat something green and leafy today & remain consistent.  Recheck INR 6 weeks. Call Coumadin Clinic [phone] with new medications, bleeding or questions.

## 2021-01-11 ENCOUNTER — Telehealth (HOSPITAL_COMMUNITY): Payer: Self-pay | Admitting: *Deleted

## 2021-01-11 MED ORDER — SILDENAFIL CITRATE 50 MG PO TABS: 50.0000 mg | ORAL_TABLET | Freq: Every day | ORAL | 3 refills | Status: DC | PRN

## 2021-01-11 NOTE — Telephone Encounter (Signed)
-----   Message from Larey Dresser, MD sent at 01/11/2021  4:37 PM EST ----- Please send in prescription for Viagra 50 mg daily prn for this patient, can give 15 tabs with refills.

## 2021-01-11 NOTE — Telephone Encounter (Signed)
Rx sent in

## 2021-01-24 ENCOUNTER — Ambulatory Visit (INDEPENDENT_AMBULATORY_CARE_PROVIDER_SITE_OTHER): Payer: No Typology Code available for payment source

## 2021-01-24 ENCOUNTER — Other Ambulatory Visit: Payer: Self-pay

## 2021-01-24 DIAGNOSIS — Z7901 Long term (current) use of anticoagulants: Secondary | ICD-10-CM | POA: Diagnosis not present

## 2021-01-24 DIAGNOSIS — Z5181 Encounter for therapeutic drug level monitoring: Secondary | ICD-10-CM | POA: Diagnosis not present

## 2021-01-24 DIAGNOSIS — I359 Nonrheumatic aortic valve disorder, unspecified: Secondary | ICD-10-CM

## 2021-01-24 LAB — POCT INR: INR: 2.6 (ref 2.0–3.0)

## 2021-01-24 NOTE — Patient Instructions (Signed)
Description   Continue taking 1 tablet daily except for 1/2 tablet on Wednesdays and Saturdays. Eat something green and leafy today & remain consistent.  Recheck INR 6 weeks. Call Coumadin Clinic [phone] with new medications, bleeding or questions.

## 2021-01-29 ENCOUNTER — Telehealth: Payer: Self-pay | Admitting: Neurology

## 2021-01-29 MED ORDER — CEREFOLIN NAC 6-2-600 MG PO TABS: 1.0000 | ORAL_TABLET | Freq: Every morning | ORAL | 0 refills | Status: DC

## 2021-01-29 NOTE — Telephone Encounter (Signed)
At 8:24 pt left a vm asking for a refill on his CEREFOLIN prescribed by Dr Leonie Man.  Pt is asking that it goes thru [facility] [phone])

## 2021-01-29 NOTE — Telephone Encounter (Signed)
Pending appt 02/28/21. Refill sent to requested pharmacy.

## 2021-02-28 ENCOUNTER — Ambulatory Visit: Payer: Self-pay | Admitting: Neurology

## 2021-03-07 ENCOUNTER — Ambulatory Visit (INDEPENDENT_AMBULATORY_CARE_PROVIDER_SITE_OTHER): Payer: No Typology Code available for payment source

## 2021-03-07 ENCOUNTER — Other Ambulatory Visit: Payer: Self-pay

## 2021-03-07 DIAGNOSIS — Z7901 Long term (current) use of anticoagulants: Secondary | ICD-10-CM | POA: Diagnosis not present

## 2021-03-07 DIAGNOSIS — I359 Nonrheumatic aortic valve disorder, unspecified: Secondary | ICD-10-CM | POA: Diagnosis not present

## 2021-03-07 DIAGNOSIS — Z5181 Encounter for therapeutic drug level monitoring: Secondary | ICD-10-CM

## 2021-03-07 LAB — POCT INR: INR: 2.9 (ref 2.0–3.0)

## 2021-03-07 NOTE — Patient Instructions (Addendum)
Description   ?Hold today's dose and then continue taking 1 tablet daily except for 1/2 tablet on Wednesdays and Saturdays. Remain consistent with greens each week. Recheck INR 6 weeks (Per pt's request. Pt aware of risk). ?Call Coumadin Clinic [phone] with new medications, bleeding or questions. ?  ?   ?

## 2021-03-17 ENCOUNTER — Other Ambulatory Visit (HOSPITAL_COMMUNITY): Payer: Self-pay | Admitting: Cardiology

## 2021-03-23 ENCOUNTER — Other Ambulatory Visit (HOSPITAL_COMMUNITY): Payer: Self-pay

## 2021-03-23 MED ORDER — SILDENAFIL CITRATE 50 MG PO TABS: 50.0000 mg | ORAL_TABLET | Freq: Every day | ORAL | 3 refills | Status: DC | PRN

## 2021-04-18 ENCOUNTER — Ambulatory Visit (INDEPENDENT_AMBULATORY_CARE_PROVIDER_SITE_OTHER): Payer: No Typology Code available for payment source

## 2021-04-18 DIAGNOSIS — Z7901 Long term (current) use of anticoagulants: Secondary | ICD-10-CM | POA: Diagnosis not present

## 2021-04-18 DIAGNOSIS — I359 Nonrheumatic aortic valve disorder, unspecified: Secondary | ICD-10-CM | POA: Diagnosis not present

## 2021-04-18 LAB — POCT INR: INR: 2.8 (ref 2.0–3.0)

## 2021-04-18 NOTE — Patient Instructions (Signed)
Description   ?Hold today's dose and then continue taking 1 tablet daily except for 1/2 tablet on Wednesdays and Saturdays. Remain consistent with greens each week. Recheck INR 6 weeks (Per pt's request. Pt aware of risk). ?Call Coumadin Clinic [phone] with new medications, bleeding or questions. ?  ?  ?

## 2021-04-20 ENCOUNTER — Other Ambulatory Visit (HOSPITAL_COMMUNITY): Payer: Self-pay | Admitting: Cardiology

## 2021-05-26 ENCOUNTER — Other Ambulatory Visit (HOSPITAL_COMMUNITY): Payer: Self-pay | Admitting: Cardiology

## 2021-05-30 ENCOUNTER — Ambulatory Visit (INDEPENDENT_AMBULATORY_CARE_PROVIDER_SITE_OTHER): Payer: No Typology Code available for payment source | Admitting: *Deleted

## 2021-05-30 DIAGNOSIS — Z7901 Long term (current) use of anticoagulants: Secondary | ICD-10-CM

## 2021-05-30 DIAGNOSIS — I359 Nonrheumatic aortic valve disorder, unspecified: Secondary | ICD-10-CM

## 2021-05-30 LAB — POCT INR: INR: 2.8 (ref 2.0–3.0)

## 2021-05-30 NOTE — Patient Instructions (Signed)
Description   Hold today's dose and then start taking 1 tablet daily except for 1/2 tablet on Wednesdays and Saturdays. Remain consistent with greens each week. Recheck INR 4 weeks (Per pt's request. Pt aware of risk). Call Coumadin Clinic [phone] with new medications, bleeding or questions.

## 2021-06-20 ENCOUNTER — Other Ambulatory Visit (HOSPITAL_COMMUNITY): Payer: Self-pay | Admitting: Cardiology

## 2021-06-27 ENCOUNTER — Other Ambulatory Visit (HOSPITAL_COMMUNITY): Payer: Self-pay | Admitting: *Deleted

## 2021-06-27 ENCOUNTER — Ambulatory Visit (INDEPENDENT_AMBULATORY_CARE_PROVIDER_SITE_OTHER): Payer: No Typology Code available for payment source | Admitting: *Deleted

## 2021-06-27 DIAGNOSIS — I359 Nonrheumatic aortic valve disorder, unspecified: Secondary | ICD-10-CM

## 2021-06-27 DIAGNOSIS — Z7901 Long term (current) use of anticoagulants: Secondary | ICD-10-CM

## 2021-06-27 LAB — POCT INR: INR: 1.9 — AB (ref 2.0–3.0)

## 2021-06-27 MED ORDER — SILDENAFIL CITRATE 50 MG PO TABS: 50.0000 mg | ORAL_TABLET | Freq: Every day | ORAL | 3 refills | Status: DC | PRN

## 2021-06-27 NOTE — Patient Instructions (Addendum)
Description   Today take 1 tablet then continue taking 1 tablet daily except for 1/2 tablet on Mondays, Wednesdays and Saturdays. Remain consistent with greens each week. Recheck INR 4 weeks (Per pt's request. Pt aware of risk). Call Coumadin Clinic [phone] or [phone] with new medications, bleeding or questions.

## 2021-07-19 ENCOUNTER — Other Ambulatory Visit (HOSPITAL_COMMUNITY): Payer: Self-pay | Admitting: Cardiology

## 2021-07-31 ENCOUNTER — Ambulatory Visit (INDEPENDENT_AMBULATORY_CARE_PROVIDER_SITE_OTHER): Payer: No Typology Code available for payment source

## 2021-07-31 DIAGNOSIS — I359 Nonrheumatic aortic valve disorder, unspecified: Secondary | ICD-10-CM

## 2021-07-31 DIAGNOSIS — Z7901 Long term (current) use of anticoagulants: Secondary | ICD-10-CM | POA: Diagnosis not present

## 2021-07-31 DIAGNOSIS — Z5181 Encounter for therapeutic drug level monitoring: Secondary | ICD-10-CM | POA: Diagnosis not present

## 2021-07-31 LAB — POCT INR: INR: 1.6 — AB (ref 2.0–3.0)

## 2021-07-31 NOTE — Patient Instructions (Signed)
INCREASE TO  1 tablet daily except for 1/2 tablet on Mondays and Wednesdays. Remain consistent with greens each week. Recheck INR 3 weeks (Per pt's request. Pt aware of risk). Call Coumadin Clinic [phone] or [phone] with new medications, bleeding or questions.

## 2021-08-20 ENCOUNTER — Other Ambulatory Visit (HOSPITAL_COMMUNITY): Payer: Self-pay | Admitting: Cardiology

## 2021-08-21 ENCOUNTER — Ambulatory Visit (INDEPENDENT_AMBULATORY_CARE_PROVIDER_SITE_OTHER): Payer: No Typology Code available for payment source | Admitting: *Deleted

## 2021-08-21 DIAGNOSIS — I359 Nonrheumatic aortic valve disorder, unspecified: Secondary | ICD-10-CM | POA: Diagnosis not present

## 2021-08-21 DIAGNOSIS — Z7901 Long term (current) use of anticoagulants: Secondary | ICD-10-CM

## 2021-08-21 LAB — POCT INR: INR: 1.7 — AB (ref 2.0–3.0)

## 2021-08-21 NOTE — Patient Instructions (Addendum)
Description   Today take 1.5 tablets then start taking 1 tablet daily. Remain consistent with greens each week. Recheck INR 10 days. Call Coumadin Clinic [phone] or [phone] with new medications, bleeding or questions.

## 2021-08-31 ENCOUNTER — Ambulatory Visit (INDEPENDENT_AMBULATORY_CARE_PROVIDER_SITE_OTHER): Payer: No Typology Code available for payment source

## 2021-08-31 DIAGNOSIS — Z5181 Encounter for therapeutic drug level monitoring: Secondary | ICD-10-CM | POA: Diagnosis not present

## 2021-08-31 DIAGNOSIS — I359 Nonrheumatic aortic valve disorder, unspecified: Secondary | ICD-10-CM

## 2021-08-31 DIAGNOSIS — Z7901 Long term (current) use of anticoagulants: Secondary | ICD-10-CM

## 2021-08-31 LAB — POCT INR: INR: 1.5 — AB (ref 2.0–3.0)

## 2021-08-31 NOTE — Patient Instructions (Signed)
Today take 2 tablets then start taking 1 tablet daily, except 1.5 tablets on Wednesday Remain consistent with greens each week. Recheck INR 3 weeks. Patient out of town. Call Coumadin Clinic [phone] or [phone] with new medications, bleeding or questions.

## 2021-09-18 ENCOUNTER — Ambulatory Visit: Payer: No Typology Code available for payment source

## 2021-09-21 ENCOUNTER — Ambulatory Visit: Payer: No Typology Code available for payment source | Attending: Internal Medicine

## 2021-09-21 DIAGNOSIS — I359 Nonrheumatic aortic valve disorder, unspecified: Secondary | ICD-10-CM | POA: Diagnosis not present

## 2021-09-21 DIAGNOSIS — Z7901 Long term (current) use of anticoagulants: Secondary | ICD-10-CM

## 2021-09-21 LAB — POCT INR: INR: 3.3 — AB (ref 2.0–3.0)

## 2021-09-21 NOTE — Patient Instructions (Signed)
Description   Hold today's dose and  then start taking 1 tablet daily Remain consistent with greens each week.  Recheck INR 2 weeks. Call Coumadin Clinic [phone] or [phone] with new medications, bleeding or questions.

## 2021-09-26 ENCOUNTER — Ambulatory Visit (HOSPITAL_COMMUNITY)
Admission: RE | Admit: 2021-09-26 | Discharge: 2021-09-26 | Disposition: A | Discharge status: Home / Self Care | Payer: No Typology Code available for payment source | Source: Ambulatory Visit | Attending: Cardiology | Admitting: Cardiology

## 2021-09-26 ENCOUNTER — Encounter (HOSPITAL_COMMUNITY): Payer: No Typology Code available for payment source | Admitting: Cardiology

## 2021-09-26 VITALS — BP 108/80 | HR 53 | Wt 215.0 lb

## 2021-09-26 DIAGNOSIS — Z7982 Long term (current) use of aspirin: Secondary | ICD-10-CM | POA: Diagnosis not present

## 2021-09-26 DIAGNOSIS — I509 Heart failure, unspecified: Secondary | ICD-10-CM | POA: Diagnosis not present

## 2021-09-26 DIAGNOSIS — E785 Hyperlipidemia, unspecified: Secondary | ICD-10-CM | POA: Diagnosis not present

## 2021-09-26 DIAGNOSIS — Z953 Presence of xenogenic heart valve: Secondary | ICD-10-CM | POA: Insufficient documentation

## 2021-09-26 DIAGNOSIS — I493 Ventricular premature depolarization: Secondary | ICD-10-CM | POA: Diagnosis not present

## 2021-09-26 DIAGNOSIS — I359 Nonrheumatic aortic valve disorder, unspecified: Secondary | ICD-10-CM | POA: Diagnosis not present

## 2021-09-26 DIAGNOSIS — I5022 Chronic systolic (congestive) heart failure: Secondary | ICD-10-CM | POA: Insufficient documentation

## 2021-09-26 DIAGNOSIS — Q231 Congenital insufficiency of aortic valve: Secondary | ICD-10-CM | POA: Insufficient documentation

## 2021-09-26 DIAGNOSIS — Z79899 Other long term (current) drug therapy: Secondary | ICD-10-CM | POA: Diagnosis not present

## 2021-09-26 DIAGNOSIS — I110 Hypertensive heart disease with heart failure: Secondary | ICD-10-CM | POA: Insufficient documentation

## 2021-09-26 DIAGNOSIS — Z952 Presence of prosthetic heart valve: Secondary | ICD-10-CM | POA: Diagnosis not present

## 2021-09-26 DIAGNOSIS — I639 Cerebral infarction, unspecified: Secondary | ICD-10-CM | POA: Insufficient documentation

## 2021-09-26 LAB — CBC
HCT: 43.7 % (ref 39.0–52.0)
Hemoglobin: 15.1 g/dL (ref 13.0–17.0)
MCH: 31.7 pg (ref 26.0–34.0)
MCHC: 34.6 g/dL (ref 30.0–36.0)
MCV: 91.6 fL (ref 80.0–100.0)
Platelets: 204 10*3/uL (ref 150–400)
RBC: 4.77 MIL/uL (ref 4.22–5.81)
RDW: 12.6 % (ref 11.5–15.5)
WBC: 4.8 10*3/uL (ref 4.0–10.5)
nRBC: 0 % (ref 0.0–0.2)

## 2021-09-26 LAB — BASIC METABOLIC PANEL
Anion gap: 6 (ref 5–15)
BUN: 16 mg/dL (ref 6–20)
CO2: 25 mmol/L (ref 22–32)
Calcium: 9.2 mg/dL (ref 8.9–10.3)
Chloride: 107 mmol/L (ref 98–111)
Creatinine, Ser: 1.13 mg/dL (ref 0.61–1.24)
GFR, Estimated: >60 mL/min (ref >60)
Glucose, Bld: 97 mg/dL (ref 70–99)
Potassium: 4.3 mmol/L (ref 3.5–5.1)
Sodium: 138 mmol/L (ref 135–145)

## 2021-09-26 LAB — LIPID PANEL
Cholesterol: 175 mg/dL (ref 0–200)
HDL: 48 mg/dL (ref >40)
LDL Cholesterol: 113 mg/dL — ABNORMAL HIGH (ref 0–99)
Total CHOL/HDL Ratio: 3.6 RATIO
Triglycerides: 69 mg/dL (ref <150)
VLDL: 14 mg/dL (ref 0–40)

## 2021-09-26 NOTE — Patient Instructions (Signed)
There has been no changes to your medications.  Labs done today, your results will be available in MyChart, we will contact you for abnormal readings.   Your physician has requested that you have an echocardiogram. Echocardiography is a painless test that uses sound waves to create images of your heart. It provides your doctor with information about the size and shape of your heart and how well your heart's chambers and valves are working. This procedure takes approximately one hour. There are no restrictions for this procedure.  Your physician recommends that you schedule a follow-up appointment in: 1 year ( September 2024) ** please call the office in June 2024 to arrange your follow up appointment **  If you have any questions or concerns before your next appointment please send [place] a message through [place] or call our office at [phone].    TO LEAVE A MESSAGE FOR THE NURSE SELECT OPTION 2, PLEASE LEAVE A MESSAGE INCLUDING: YOUR NAME DATE OF BIRTH CALL BACK NUMBER REASON FOR CALL**this is important as we prioritize the call backs  YOU WILL RECEIVE A CALL BACK THE SAME DAY AS LONG AS YOU CALL BEFORE 4:00 PM  At the [facility], you and your health needs are our priority. As part of our continuing mission to provide you with exceptional heart care, we have created designated Provider Care Teams. These Care Teams include your primary Cardiologist (physician) and Advanced Practice Providers (APPs- Physician Assistants and Nurse Practitioners) who all work together to provide you with the care you need, when you need it.   You may see any of the following providers on your designated Care Team at your next follow up: Dr Glori Bickers Dr Loralie Champagne Dr. Roxana Hires, NP Lyda Jester, [place] [facility] [place], [place] Forestine Na, NP Audry Riles, PharmD   Please be sure to bring in all your medications bottles to every appointment.

## 2021-09-27 NOTE — Progress Notes (Signed)
Patient ID: Corey Rose, male   DOB: [birth year], 46 y.o.   MRN: [id number] PCP: Dr. Joylene Draft Cardiology: Dr. Aundra Dubin  46 y.o.with history of bicuspid valve disorder s/p AVR x 3 presents for followup of aortic valve disorder.  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  He had a homograft aortic valve placed at that time.  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  He had repeat AVR with a Freestyle Medtronic bioprosthetic valve in 2009. Both initial AVRs were at [facility] (Dr. Clementeen Graham).    In 2019, he was noted to have a louder diastolic murmur and significant aortic insufficiency was noted.  He was seen again at [facility], and decision was made to undergo TAVR.  Initial TAVR valve was undersized and migrated.  A wall stent was used to crush this valve against the wall of the aortic root, leading to severe AI.  The patient developed flash pulmonary edema and was put on ECMO for several days. After wall stent was placed, a Medtronic Corevalve was deployed in the aortic position.  The patient was taken off ECMO.  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 He underwent extensive rehab at the [facility] in [place] and seems to have recovered nearly fully from strokes.   Repeat TEE in 8/19 showed severe peri-valvular aortic insufficiency around the Medtronic Corevalve prosthesis with preserved EF.   Patient was then seen at the [facility].  He had a 3rd sternotomy with removal of the 2 TAVR valves and wall stent, Bentall procedure with On-X mechanical aortic valve/28 mm Hemashield graft and reimplantation of the coronaries.    Echo in 9/19 showed EF 50-55% (low normal to mildly decreased), mildly dilated RV with mildly decreased systolic function, normal mechanical aortic valve.  Repeat echo in 6/20 showed stable EF 50-55%, mild LV dilation, mild RV dilation with normal systolic function, normal  mechanical aortic valve.   Echo in 3/22 showed EF 50% with mild LV dilation, normal RV, On-X mechanical aortic valve functions normally.   He returns today for followup of mechanical AVR.  He is doing well.  He went on a hunting trip for 14 days recently, camping out and hiking in the mountains in [place].  No exertional dyspnea or chest pain.  He is able to jog without problems.  No lightheadedness or syncope.  No issues with bleeding.   ECG (personally reviewed): NSR, iRBBB  Labs (12/13): K 4.5, creatinine 0.9, HDL 41, LDL 194, TSH normal Labs (3/16): LDL 111, HDL 44  Labs (9/19): K 4.2, creatinine 0.94, hgb 10.2 Labs (1/20): LDL 81 Labs (6/20): K 4.1, creatinine 1.18 Labs (11/21): LDL 98, HDL 54 Labs (4/22): K 4.5, creatinine 1.11  PMH: 1. Hyperlipidemia: Myalgias with simvastatin. 2. 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 AVRs were both at [facility], Dr. Clementeen Graham.  Echo (1/14) with EF 55-60%, bioprosthetic aortic valve with mean gradient 5 mmHg.  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  Echo (3/16) with EF 55-60%, bioprosthetic aortic valve with mean gradient 4 mmHg and trivial AI, normal RV size and systolic function.  - Echo (4/18): EF 60-65%, well-seated bioprosthetic aortic valve with no stenosis, mild regurgitation.  - MRA chest (  1/17) with 4.3 cm ascending aorta.  - Development of severe AI in 2019 => he had a complicated procedure in 6/19 with failed initial TAVR with migration of valve followed by wall stent followed Medtronic Corevalve.   - TAVR valve noted to have severe peri-valvular regurgitation on 8/19 TEE.  - In 9/19, He had a 3rd sternotomy with removal of the 2 TAVR valves and wall stent, Bentall procedure with On-X mechanical aortic valve/28 mm Hemashield  graft and reimplantation of the coronaries.  - Echo (9/19) showed EF 50-55% (low normal to mildly decreased), mildly dilated RV with mildly decreased systolic function, normal mechanical aortic valve.  - Echo (6/20) showed stable EF 50-55%, mild LV dilation, mild RV dilation with normal systolic function, normal mechanical aortic valve (On-X).   - Echo (3/22): EF 50% with mild LV dilation, normal RV, On-X mechanical aortic valve functions normally.  - CTA chest (6/22): Stable post-op Bentall 3. H/o prostatitis 4. H/o bladder stones 5. CVAs: Peri-operatively with failed TAVR in 6/19.  6. PVCs  SH: Married, 2 children, lives in [place].  Owns [employer]   FH: Father with atrial fibrillation, grandfather with "heart disease."   ROS: All systems reviewed and negative except as per HPI.   Current Outpatient Medications  Medication Sig Dispense Refill   Apoaequorin (PREVAGEN PO) Take by mouth.     aspirin 81 MG tablet Take 1 tablet (81 mg total) by mouth daily. 30 tablet 3   ENTRESTO 24-26 MG TAKE ONE TABLET BY MOUTH TWICE DAILY 180 tablet 0   escitalopram (LEXAPRO) 10 MG tablet Take 20 mg by mouth daily.      Melatonin 3 MG TABS Take 6 mg by mouth at bedtime.     metoprolol succinate (TOPROL-XL) 25 MG 24 hr tablet TAKE ONE TABLET BY MOUTH AT BEDTIME 60 tablet 0   mirtazapine (REMERON) 7.5 MG tablet Take 7.5 mg by mouth at bedtime.     Rosuvastatin Calcium 10 MG CPSP Take 10 mg by mouth daily.      Tamsulosin HCl (FLOMAX) 0.4 MG CAPS Take 0.4 mg by mouth daily.     warfarin (COUMADIN) 5 MG tablet Take as directed by coumadin clinic 60 tablet 11   No current facility-administered medications for this encounter.   BP 108/80   Pulse (!) 53   Wt 97.5 kg (215 lb)   SpO2 98%   BMI 26.87 kg/m  General: NAD Neck: No JVD, no thyromegaly or thyroid nodule.  Lungs: Clear to auscultation bilaterally with normal respiratory effort. CV: Nondisplaced PMI.  Heart regular S1/S2 with mechanical S2, no  S3/S4, 1/6 SEM RUSB.  No peripheral edema.  No carotid bruit.  Normal pedal pulses.  Abdomen: Soft, nontender, no hepatosplenomegaly, no distention.  Skin: Intact without lesions or rashes.  Neurologic: Alert and oriented x 3.  Psych: Normal affect. Extremities: No clubbing or cyanosis.  HEENT: Normal.    Assessment/Plan: 1. Bicuspid aortic valve disorder: Patient had bicuspid aortic valve and developed endocarditis.  He had AVR initially in 1997 then developed a pseudoaneurysm of the aortic root and repeat AVR with a bioprosthetic valve in 2009.  2019 developed severe AI, had failed TAVR procedure in 6/19 followed by Bentall procedure with On-X mechanical aortic valve/28 mm Hemashield graft and coronary reimplantation in 9/19.   CTA chest showed stable ascending aorta in 6/22.  - Needs antibiotic prophylaxis with dental procedures.  - Patient has an On-X mechanical valve. Based on data from PROACT trial, he could  decrease INR goal to 1.5-2 long-term.  He has had no bleeding issues, will keep his goal 2-2.5 for now.   - Continue ASA 81 daily.   - Repeat echo to reassess valve.  - CBC today.  2. Hyperlipidemia: Check lipids today.  3. CVAs: Peri-operatively with failed TAVR.  He has recovered back to his baseline.  4. PVCs: No palpitations recently.  5. Chronic HF with mid-range EF: Echo in 3/22 was similar to priors, with mildly dilated LV and EF 50%.   - Continue Toprol XL 25 mg daily, will not increase with low HR.   - Continue Entresto 24/26 bid. BMET today.  - I will arrange for repeat echo.   Followup in 6 months.   Loralie Champagne 09/27/2021

## 2021-10-01 ENCOUNTER — Telehealth (HOSPITAL_COMMUNITY): Payer: Self-pay | Admitting: Surgery

## 2021-10-01 ENCOUNTER — Other Ambulatory Visit (HOSPITAL_COMMUNITY): Payer: No Typology Code available for payment source

## 2021-10-01 ENCOUNTER — Encounter (HOSPITAL_COMMUNITY): Payer: No Typology Code available for payment source | Admitting: Cardiology

## 2021-10-01 DIAGNOSIS — E785 Hyperlipidemia, unspecified: Secondary | ICD-10-CM

## 2021-10-01 NOTE — Telephone Encounter (Signed)
Patient called and reviewed results per Dr. Aundra Dubin.  I have updated medlist and scheduled repeat labwork for November.

## 2021-10-01 NOTE — Telephone Encounter (Signed)
-----   Message from Larey Dresser, MD sent at 09/27/2021  4:09 PM EDT ----- Increase Crestor to 20 mg daily with lipids/LFTs in 2 months.

## 2021-10-02 ENCOUNTER — Other Ambulatory Visit (HOSPITAL_COMMUNITY): Payer: Self-pay | Admitting: *Deleted

## 2021-10-02 MED ORDER — ROSUVASTATIN CALCIUM 20 MG PO TABS: 20.0000 mg | ORAL_TABLET | Freq: Every day | ORAL | 3 refills | Status: DC

## 2021-10-02 MED ORDER — ROSUVASTATIN CALCIUM 10 MG PO CPSP: 20.0000 mg | ORAL_CAPSULE | Freq: Every day | ORAL | 3 refills | Status: DC

## 2021-10-05 ENCOUNTER — Ambulatory Visit: Payer: No Typology Code available for payment source | Attending: Cardiology | Admitting: *Deleted

## 2021-10-05 DIAGNOSIS — I359 Nonrheumatic aortic valve disorder, unspecified: Secondary | ICD-10-CM | POA: Diagnosis not present

## 2021-10-05 DIAGNOSIS — Z7901 Long term (current) use of anticoagulants: Secondary | ICD-10-CM | POA: Diagnosis not present

## 2021-10-05 LAB — POCT INR: INR: 2.3 (ref 2.0–3.0)

## 2021-10-05 NOTE — Patient Instructions (Signed)
Description   Continue taking 1 tablet daily. Remain consistent with greens each week.  Recheck INR 3 weeks. Call Coumadin Clinic [phone] or [phone] with new medications, bleeding or questions.

## 2021-10-10 ENCOUNTER — Ambulatory Visit (HOSPITAL_COMMUNITY)
Admission: RE | Admit: 2021-10-10 | Discharge: 2021-10-10 | Disposition: A | Discharge status: Home / Self Care | Payer: No Typology Code available for payment source | Source: Ambulatory Visit | Attending: Cardiology | Admitting: Cardiology

## 2021-10-10 ENCOUNTER — Other Ambulatory Visit (HOSPITAL_COMMUNITY): Payer: Self-pay | Admitting: Cardiology

## 2021-10-10 DIAGNOSIS — Z952 Presence of prosthetic heart valve: Secondary | ICD-10-CM | POA: Insufficient documentation

## 2021-10-10 DIAGNOSIS — I359 Nonrheumatic aortic valve disorder, unspecified: Secondary | ICD-10-CM | POA: Insufficient documentation

## 2021-10-10 DIAGNOSIS — I509 Heart failure, unspecified: Secondary | ICD-10-CM | POA: Insufficient documentation

## 2021-10-10 DIAGNOSIS — I110 Hypertensive heart disease with heart failure: Secondary | ICD-10-CM | POA: Diagnosis not present

## 2021-10-10 LAB — ECHOCARDIOGRAM COMPLETE
AR max vel: 4.3 cm2
AV Area VTI: 4.18 cm2
AV Area mean vel: 3.67 cm2
AV Mean grad: 8.7 mmHg
AV Peak grad: 15.1 mmHg
Ao pk vel: 1.94 m/s
Area-P 1/2: 1.8 cm2
Calc EF: 51.1 %
S' Lateral: 3.8 cm
Single Plane A2C EF: 52.8 %
Single Plane A4C EF: 53.1 %

## 2021-10-10 NOTE — Progress Notes (Signed)
Echocardiogram 2D Echocardiogram has been performed.  Fidel Levy 10/10/2021, 4:28 PM

## 2021-10-23 ENCOUNTER — Other Ambulatory Visit (HOSPITAL_COMMUNITY): Payer: Self-pay

## 2021-10-30 ENCOUNTER — Ambulatory Visit: Payer: No Typology Code available for payment source | Attending: Cardiology

## 2021-10-30 DIAGNOSIS — Z7901 Long term (current) use of anticoagulants: Secondary | ICD-10-CM | POA: Diagnosis not present

## 2021-10-30 DIAGNOSIS — I359 Nonrheumatic aortic valve disorder, unspecified: Secondary | ICD-10-CM | POA: Diagnosis not present

## 2021-10-30 LAB — POCT INR: INR: 2.7 (ref 2.0–3.0)

## 2021-10-30 NOTE — Patient Instructions (Signed)
Continue taking 1 tablet daily. Remain consistent with greens each week.  Recheck INR 4 weeks. Call Coumadin Clinic [phone] or [phone] with new medications, bleeding or questions. Eat greens tonight.

## 2021-11-15 ENCOUNTER — Other Ambulatory Visit (HOSPITAL_COMMUNITY): Payer: Self-pay | Admitting: Cardiology

## 2021-11-19 ENCOUNTER — Other Ambulatory Visit (HOSPITAL_COMMUNITY): Payer: Self-pay | Admitting: Cardiology

## 2021-11-27 ENCOUNTER — Ambulatory Visit: Payer: No Typology Code available for payment source | Attending: Internal Medicine | Admitting: *Deleted

## 2021-11-27 DIAGNOSIS — I359 Nonrheumatic aortic valve disorder, unspecified: Secondary | ICD-10-CM

## 2021-11-27 DIAGNOSIS — Z7901 Long term (current) use of anticoagulants: Secondary | ICD-10-CM | POA: Diagnosis not present

## 2021-11-27 LAB — POCT INR: INR: 2.5 (ref 2.0–3.0)

## 2021-11-27 NOTE — Patient Instructions (Signed)
Description   Continue taking 1 tablet daily. Remain consistent with greens each week.  Recheck INR 4 weeks. Call Coumadin Clinic [phone] or [phone] with new medications, bleeding or questions. Eat greens tonight.

## 2021-12-04 ENCOUNTER — Other Ambulatory Visit (HOSPITAL_COMMUNITY): Payer: No Typology Code available for payment source

## 2021-12-05 ENCOUNTER — Ambulatory Visit (HOSPITAL_COMMUNITY)
Admission: RE | Admit: 2021-12-05 | Discharge: 2021-12-05 | Disposition: A | Discharge status: Home / Self Care | Payer: No Typology Code available for payment source | Source: Ambulatory Visit | Attending: Internal Medicine | Admitting: Internal Medicine

## 2021-12-05 DIAGNOSIS — E785 Hyperlipidemia, unspecified: Secondary | ICD-10-CM | POA: Insufficient documentation

## 2021-12-05 LAB — LIPID PANEL
Cholesterol: 196 mg/dL (ref 0–200)
HDL: 46 mg/dL (ref >40)
LDL Cholesterol: 132 mg/dL — ABNORMAL HIGH (ref 0–99)
Total CHOL/HDL Ratio: 4.3 RATIO
Triglycerides: 92 mg/dL (ref <150)
VLDL: 18 mg/dL (ref 0–40)

## 2021-12-06 ENCOUNTER — Telehealth (HOSPITAL_COMMUNITY): Payer: Self-pay

## 2021-12-06 DIAGNOSIS — E785 Hyperlipidemia, unspecified: Secondary | ICD-10-CM

## 2021-12-06 MED ORDER — ROSUVASTATIN CALCIUM 40 MG PO TABS: 40.0000 mg | ORAL_TABLET | Freq: Every day | ORAL | 3 refills | Status: DC

## 2021-12-06 NOTE — Telephone Encounter (Addendum)
Pt aware, agreeable, and verbalized understanding  Med list up dated and labs scheduled   ----- Message from Larey Dresser, MD sent at 12/05/2021  4:29 PM EST ----- LDL is higher again, up to 132.  If he is taking Crestor 20 mg daily, needs to increase to 40 mg daily with lipids/LFTs in 2 months.

## 2021-12-17 ENCOUNTER — Other Ambulatory Visit (HOSPITAL_COMMUNITY): Payer: Self-pay

## 2021-12-17 MED ORDER — ROSUVASTATIN CALCIUM 40 MG PO TABS: 40.0000 mg | ORAL_TABLET | Freq: Every day | ORAL | 3 refills | Status: DC

## 2021-12-25 ENCOUNTER — Ambulatory Visit: Payer: No Typology Code available for payment source | Attending: Cardiology | Admitting: *Deleted

## 2021-12-25 DIAGNOSIS — Z7901 Long term (current) use of anticoagulants: Secondary | ICD-10-CM

## 2021-12-25 DIAGNOSIS — I359 Nonrheumatic aortic valve disorder, unspecified: Secondary | ICD-10-CM

## 2021-12-25 LAB — POCT INR: INR: 2.6 (ref 2.0–3.0)

## 2021-12-25 NOTE — Patient Instructions (Addendum)
Description   Today take 1/2 tablet of warfarin then continue taking 1 tablet daily. Remain consistent with greens each week. Recheck INR 5 weeks. Call Coumadin Clinic [phone] or [phone] with new medications, bleeding or questions. Eat greens tonight.

## 2022-01-29 ENCOUNTER — Ambulatory Visit: Payer: No Typology Code available for payment source | Attending: Cardiology | Admitting: *Deleted

## 2022-01-29 DIAGNOSIS — I359 Nonrheumatic aortic valve disorder, unspecified: Secondary | ICD-10-CM

## 2022-01-29 DIAGNOSIS — Z7901 Long term (current) use of anticoagulants: Secondary | ICD-10-CM

## 2022-01-29 LAB — POCT INR: INR: 4.1 — AB (ref 2.0–3.0)

## 2022-01-29 NOTE — Patient Instructions (Signed)
Description   Do not take any warfarin today and tomorrow take 1/2 tablet then continue taking 1 tablet daily. Remain consistent with greens each week. Recheck INR 2 weeks. Call Coumadin Clinic [phone] or [phone] with new medications, bleeding or questions. Eat greens tonight.

## 2022-02-07 ENCOUNTER — Other Ambulatory Visit (HOSPITAL_COMMUNITY): Payer: No Typology Code available for payment source

## 2022-02-08 ENCOUNTER — Ambulatory Visit (HOSPITAL_COMMUNITY)
Admission: RE | Admit: 2022-02-08 | Discharge: 2022-02-08 | Disposition: A | Discharge status: Home / Self Care | Payer: No Typology Code available for payment source | Source: Ambulatory Visit | Attending: Internal Medicine | Admitting: Internal Medicine

## 2022-02-08 DIAGNOSIS — E785 Hyperlipidemia, unspecified: Secondary | ICD-10-CM | POA: Diagnosis present

## 2022-02-08 LAB — LIPID PANEL
Cholesterol: 123 mg/dL (ref 0–200)
HDL: 49 mg/dL (ref >40)
LDL Cholesterol: 58 mg/dL (ref 0–99)
Total CHOL/HDL Ratio: 2.5 RATIO
Triglycerides: 78 mg/dL (ref <150)
VLDL: 16 mg/dL (ref 0–40)

## 2022-02-08 LAB — HEPATIC FUNCTION PANEL
ALT: 31 U/L (ref 0–44)
AST: 27 U/L (ref 15–41)
Albumin: 4.5 g/dL (ref 3.5–5.0)
Alkaline Phosphatase: 42 U/L (ref 38–126)
Bilirubin, Direct: 0.1 mg/dL (ref 0.0–0.2)
Indirect Bilirubin: 0.7 mg/dL (ref 0.3–0.9)
Total Bilirubin: 0.8 mg/dL (ref 0.3–1.2)
Total Protein: 6.7 g/dL (ref 6.5–8.1)

## 2022-02-12 ENCOUNTER — Ambulatory Visit: Payer: No Typology Code available for payment source | Attending: Internal Medicine | Admitting: *Deleted

## 2022-02-12 DIAGNOSIS — I359 Nonrheumatic aortic valve disorder, unspecified: Secondary | ICD-10-CM | POA: Diagnosis not present

## 2022-02-12 DIAGNOSIS — Z7901 Long term (current) use of anticoagulants: Secondary | ICD-10-CM

## 2022-02-12 LAB — POCT INR: INR: 3 (ref 2.0–3.0)

## 2022-02-12 NOTE — Patient Instructions (Signed)
Description   Do not take any warfarin today then start taking 1 tablet daily except 1/2 tablet on Mondays. Remain consistent with greens each week. Recheck INR 3 weeks. Call Coumadin Clinic [phone] or [phone] with new medications, bleeding or questions. Eat greens tonight.

## 2022-02-15 ENCOUNTER — Other Ambulatory Visit (HOSPITAL_COMMUNITY): Payer: Self-pay | Admitting: Cardiology

## 2022-03-05 ENCOUNTER — Ambulatory Visit: Payer: No Typology Code available for payment source

## 2022-03-08 ENCOUNTER — Ambulatory Visit: Payer: No Typology Code available for payment source | Attending: Cardiology | Admitting: *Deleted

## 2022-03-08 DIAGNOSIS — I359 Nonrheumatic aortic valve disorder, unspecified: Secondary | ICD-10-CM | POA: Diagnosis not present

## 2022-03-08 DIAGNOSIS — Z7901 Long term (current) use of anticoagulants: Secondary | ICD-10-CM

## 2022-03-08 LAB — POCT INR: POC INR: 4.3

## 2022-03-08 NOTE — Patient Instructions (Addendum)
Description   Hold warfarin today and tomorrow Then continue to take warfarin 2 tablets daily except for 1 tablet on Sundays. Recheck INR in 2 weeks. Coumadin Clinic [phone]

## 2022-03-22 ENCOUNTER — Ambulatory Visit: Payer: No Typology Code available for payment source | Attending: Cardiovascular Disease | Admitting: *Deleted

## 2022-03-22 DIAGNOSIS — Z7901 Long term (current) use of anticoagulants: Secondary | ICD-10-CM

## 2022-03-22 DIAGNOSIS — I359 Nonrheumatic aortic valve disorder, unspecified: Secondary | ICD-10-CM | POA: Diagnosis not present

## 2022-03-22 LAB — POCT INR: INR: 2.4 (ref 2.0–3.0)

## 2022-03-22 NOTE — Patient Instructions (Addendum)
Description   Continue taking the dose you have been taking which is warfarin 2 tablets daily except for 1 tablet on Sundays and Wednesdays. Recheck INR in 3 weeks. Coumadin Clinic [phone]

## 2022-04-10 ENCOUNTER — Ambulatory Visit: Payer: No Typology Code available for payment source

## 2022-04-18 ENCOUNTER — Ambulatory Visit: Payer: No Typology Code available for payment source | Attending: Cardiology | Admitting: *Deleted

## 2022-04-18 DIAGNOSIS — I359 Nonrheumatic aortic valve disorder, unspecified: Secondary | ICD-10-CM

## 2022-04-18 DIAGNOSIS — Z7901 Long term (current) use of anticoagulants: Secondary | ICD-10-CM | POA: Diagnosis not present

## 2022-04-18 LAB — POCT INR: POC INR: 2.6

## 2022-04-18 NOTE — Patient Instructions (Signed)
Description   Take 1 tablet of warfarin today and then continue taking warfarin 2 tablets daily except for 1 tablet on Sundays and Wednesdays. Recheck INR in 3 weeks. Coumadin Clinic [phone]

## 2022-04-25 ENCOUNTER — Telehealth: Payer: Self-pay | Admitting: *Deleted

## 2022-04-25 NOTE — Telephone Encounter (Signed)
Pt called and stated that he was starting a prednisone taper today. Pt is to take 30 mg daily of prednisone today and then gradually decrease to  daily over 6 days.   Day 1-30mg  Day 2-25mg  Day 3-  Day 4-  Day 5-  Day 6-    Instructed pt to make sure he is eating his greens especially the first 3 days while he is on the prednisone. Instead of taking 2 tablets of warfarin tomorrow instructed pt to take 1 tablet ( ) of warfarin. Pt is coming in on 4/23 at 9:15 to have INR checked.

## 2022-04-30 ENCOUNTER — Ambulatory Visit: Payer: No Typology Code available for payment source

## 2022-05-01 ENCOUNTER — Ambulatory Visit: Payer: No Typology Code available for payment source | Attending: Internal Medicine | Admitting: *Deleted

## 2022-05-01 DIAGNOSIS — I359 Nonrheumatic aortic valve disorder, unspecified: Secondary | ICD-10-CM

## 2022-05-01 DIAGNOSIS — Z7901 Long term (current) use of anticoagulants: Secondary | ICD-10-CM

## 2022-05-01 LAB — POCT INR: INR: 2.3 (ref 2.0–3.0)

## 2022-05-01 NOTE — Patient Instructions (Signed)
Description   Continue taking warfarin 2 tablets daily except for 1 tablet on Sundays and Wednesdays. Recheck INR in 4 weeks. Coumadin Clinic [phone]

## 2022-05-17 ENCOUNTER — Other Ambulatory Visit (HOSPITAL_COMMUNITY): Payer: Self-pay | Admitting: Cardiology

## 2022-05-29 ENCOUNTER — Ambulatory Visit: Payer: No Typology Code available for payment source | Attending: Cardiovascular Disease | Admitting: *Deleted

## 2022-05-29 DIAGNOSIS — Z7901 Long term (current) use of anticoagulants: Secondary | ICD-10-CM

## 2022-05-29 DIAGNOSIS — I359 Nonrheumatic aortic valve disorder, unspecified: Secondary | ICD-10-CM | POA: Diagnosis not present

## 2022-05-29 LAB — POCT INR: INR: 3.2 — AB (ref 2.0–3.0)

## 2022-05-29 NOTE — Patient Instructions (Signed)
Description   Do not take any warfarin today then continue taking warfarin 2 tablets daily except for 1 tablet on Sundays and Wednesdays. Recheck INR in 3 weeks. Coumadin Clinic [phone] or [phone]

## 2022-06-17 ENCOUNTER — Telehealth: Payer: Self-pay

## 2022-06-17 NOTE — Telephone Encounter (Signed)
Patient on Doxycycline for 10 days.  I informed him to eat greens every other day until his INR check 6/18.  He verbalized understanding

## 2022-06-18 ENCOUNTER — Other Ambulatory Visit (HOSPITAL_COMMUNITY): Payer: Self-pay

## 2022-06-18 DIAGNOSIS — I359 Nonrheumatic aortic valve disorder, unspecified: Secondary | ICD-10-CM

## 2022-06-24 ENCOUNTER — Encounter: Payer: Self-pay | Admitting: Pharmacist Clinician (PhC)/ Clinical Pharmacy Specialist

## 2022-06-24 ENCOUNTER — Ambulatory Visit
Payer: No Typology Code available for payment source | Attending: Cardiovascular Disease | Admitting: Pharmacist Clinician (PhC)/ Clinical Pharmacy Specialist

## 2022-06-24 ENCOUNTER — Telehealth: Payer: Self-pay | Admitting: Pharmacist Clinician (PhC)/ Clinical Pharmacy Specialist

## 2022-06-24 ENCOUNTER — Ambulatory Visit
Payer: No Typology Code available for payment source | Admitting: Pharmacist Clinician (PhC)/ Clinical Pharmacy Specialist

## 2022-06-24 VITALS — Ht 75.0 in | Wt 235.8 lb

## 2022-06-24 DIAGNOSIS — Z7901 Long term (current) use of anticoagulants: Secondary | ICD-10-CM

## 2022-06-24 DIAGNOSIS — I359 Nonrheumatic aortic valve disorder, unspecified: Secondary | ICD-10-CM | POA: Diagnosis not present

## 2022-06-24 DIAGNOSIS — E663 Overweight: Secondary | ICD-10-CM

## 2022-06-24 LAB — POCT INR: INR: 3.7 — AB (ref 2.0–3.0)

## 2022-06-24 NOTE — Telephone Encounter (Signed)
Please do PA for Zepbound or Ozempic,

## 2022-06-24 NOTE — Patient Instructions (Signed)
We will start the prior authorization process to get Zepbound (or [place]) covered by your insurance.   TIPS FOR SUCCESS Write down the reasons why you want to lose weight and post it in a place where you'll see it often. Start small and work your way up. Keep in mind that it takes time to achieve goals, and small steps add up. Any additional movements help to burn calories. Taking the stairs rather than the elevator and parking at the far end of your parking lot are easy ways to start. Brisk walking for at least 30 minutes 4 or more days of the week is an excellent goal to work toward  [employer] WHAT IT MEANS TO FEEL FULL Did you know that it can take 15 minutes or more for your brain to receive the message that you've eaten? That means that, if you eat less food, but consume it slower, you may still feel satisfied. Eating a lot of fruits and vegetables can also help you feel fuller. Eat off of smaller plates so that moderate portions don't seem too small  TITRATION PLAN Will plan to follow the titration plan as below, pending patient is tolerating each dose before increasing to the next. Can slow titration if needed for tolerability.    -Weeks 1-4: Inject 0.25 mg SQ once weekly  (Zepbound 2.5 mg) -Weeks 5-8: Inject 0.5 mg SQ once weekly   (Zepbound 5 mg) -Weeks 9-12 Inject 1 mg SQ once weekly      (Zepbound 7.5 mg) -Weeks 13-16: Inject 1.7 mg SQ once weekly     (Zepbound 10 mg)  Follow up in 3-4 months.  If you have any questions or concerns, please reach out to [place].  Aeriana Speece/Chris at [phone].  THANK YOU FOR CHOOSING [facility]

## 2022-06-24 NOTE — Progress Notes (Signed)
Office Visit    Patient Name: Corey Rose Date of Encounter: 06/24/2022  Primary Care Provider:  Rodrigo Ran, MD Primary Cardiologist:  None  Chief Complaint    Weight management  Significant Past Medical History   HLD 2/24 LDL 58 on rosuvastatin 40  CVA While on ECMO after valve surgery (embolic shower), underwent extensive rehab  AVR Mechanical aortic valve, (severe endocarditis p/dental procedure 1997) on warfarin - current valve is On-X    Allergies  Allergen Reactions   Zocor [Simvastatin]     Muscle pain    History of Present Illness    Corey Rose is a 47 y.o. male patient of Dr Shirlee Latch, in the office today to discuss weight management.    Current weight management medications: none  Previously tried meds: none  Current meds that may affect weight:  mirtazapine  Baseline weight/BMI: 107 kg // 29.47  Insurance payor: [employer]  Diet: mix of home and out; worse when kids at home, more likely to eat out, fast foods (7,13);otherwise at home protein is chicken, fish, steak; vegetables - lots of salad, asparagus, broccoli; does eat cookies; some diet sodas; coffee, water daily  Exercise: up to 4 miles 2-3 times per week, walks some  Family History: father has AF, still living;  mother had htn, choleterol, deceased;  1 brother healthy;   Confirmed patient not no personal or family history of medullary thyroid carcinoma (MTC) or Multiple Endocrine Neoplasia syndrome type 2 (MEN 2).   Social History:   Tobacco: no  Alcohol:no  Caffeine: soda and coffee  Accessory Clinical Findings    Lab Results  Component Value Date   CREATININE 1.13 09/26/2021   BUN 16 09/26/2021   NA 138 09/26/2021   K 4.3 09/26/2021   CL 107 09/26/2021   CO2 25 09/26/2021   Lab Results  Component Value Date   ALT 31 02/08/2022   AST 27 02/08/2022   ALKPHOS 42 02/08/2022   BILITOT 0.8 02/08/2022   Lab Results  Component Value Date   HGBA1C 5.5 11/08/2019     Home Medications/Allergies    Current Outpatient Medications  Medication Sig Dispense Refill   Apoaequorin (PREVAGEN PO) Take by mouth.     aspirin 81 MG tablet Take 1 tablet (81 mg total) by mouth daily. 30 tablet 3   escitalopram (LEXAPRO) 10 MG tablet Take 20 mg by mouth daily.      Melatonin 3 MG TABS Take 6 mg by mouth at bedtime.     metoprolol succinate (TOPROL-XL) 25 MG 24 hr tablet TAKE ONE TABLET BY MOUTH AT BEDTIME 180 tablet 3   mirtazapine (REMERON) 7.5 MG tablet Take 7.5 mg by mouth at bedtime.     rosuvastatin (CRESTOR) 40 MG tablet Take 1 tablet (40 mg total) by mouth daily. 90 tablet 3   sacubitril-valsartan (ENTRESTO) 24-26 MG TAKE ONE TABLET BY MOUTH TWICE DAILY 180 tablet 3   Tamsulosin HCl (FLOMAX) 0.4 MG CAPS Take 0.4 mg by mouth daily.     warfarin (COUMADIN) 5 MG tablet TAKE AS DIRECTED BY COUMADIN CLINIC 60 tablet 11   No current facility-administered medications for this visit.     Allergies  Allergen Reactions   Zocor [Simvastatin]     Muscle pain    Assessment & Plan    Overweight (BMI 25.0-29.9)  Patient has not met goal of at least 5% of body weight loss with comprehensive lifestyle modifications alone in the past 3-6 months. Pharmacotherapy  is appropriate to pursue as augmentation. Will start Zepbound.   Confirmed patient has no personal or family history of medullary thyroid carcinoma (MTC) or Multiple Endocrine Neoplasia syndrome type 2 (MEN 2).   Advised patient on common side effects including nausea, diarrhea, dyspepsia, decreased appetite, and fatigue. Counseled patient on reducing meal size and how to titrate medication to minimize side effects. Patient aware to call if intolerable side effects or if experiencing dehydration, abdominal pain, or dizziness. Patient will adhere to dietary modifications and will target at least 150 minutes of moderate intensity exercise weekly.   Injection technique reviewed at today's visit.    Titration  Plan:  Will plan to follow the titration plan as below, pending patient is tolerating each dose before increasing to the next. Can slow titration if needed for tolerability.    -Month 1: Inject 2.5 mg SQ once weekly x 4 weeks -Month 2: Inject 5 mg SQ once weekly x 4 weeks -Month 3: Inject 7.5 mg SQ once weekly x 4 weeks -Month 4+: Inject 10 mg SQ once weekly   Follow up in 3-4 months.    Phillips Hay PharmD CPP [facility]  [address] [place], [place] [address] [phone]

## 2022-06-24 NOTE — Assessment & Plan Note (Signed)
  Patient has not met goal of at least 5% of body weight loss with comprehensive lifestyle modifications alone in the past 3-6 months. Pharmacotherapy is appropriate to pursue as augmentation. Will start Zepbound.   Confirmed patient has no personal or family history of medullary thyroid carcinoma (MTC) or Multiple Endocrine Neoplasia syndrome type 2 (MEN 2).   Advised patient on common side effects including nausea, diarrhea, dyspepsia, decreased appetite, and fatigue. Counseled patient on reducing meal size and how to titrate medication to minimize side effects. Patient aware to call if intolerable side effects or if experiencing dehydration, abdominal pain, or dizziness. Patient will adhere to dietary modifications and will target at least 150 minutes of moderate intensity exercise weekly.   Injection technique reviewed at today's visit.    Titration Plan:  Will plan to follow the titration plan as below, pending patient is tolerating each dose before increasing to the next. Can slow titration if needed for tolerability.    -Month 1: Inject 2.5 mg SQ once weekly x 4 weeks -Month 2: Inject 5 mg SQ once weekly x 4 weeks -Month 3: Inject 7.5 mg SQ once weekly x 4 weeks -Month 4+: Inject 10 mg SQ once weekly   Follow up in 3-4 months.

## 2022-06-24 NOTE — Patient Instructions (Signed)
Do not take any warfarin today (Monday June 17)  then take 5 mg on Tuesday June 18.  Then continue taking warfarin 2 tablets daily except for 1 tablet on Sundays and Wednesdays. Recheck INR in 4 weeks.  (Pt on vacation). Coumadin Clinic [phone] or [phone]

## 2022-06-25 ENCOUNTER — Other Ambulatory Visit (HOSPITAL_COMMUNITY): Payer: Self-pay

## 2022-06-25 ENCOUNTER — Telehealth: Payer: Self-pay

## 2022-06-25 ENCOUNTER — Ambulatory Visit: Payer: No Typology Code available for payment source

## 2022-06-25 NOTE — Telephone Encounter (Signed)
Pharmacy Patient Advocate Encounter   Received notification from [facility] that prior authorization for [facility] is needed.    PA submitted on 06/25/22 Key [id number] Status is pending  Haze Rushing, CPhT Pharmacy Patient Advocate Specialist Direct Number: [phone] Fax: [phone]

## 2022-06-25 NOTE — Telephone Encounter (Signed)
Zepbound is plan exclusion. PA initiated for Ozempic, please see separate encounter for updates on determination. (I will route you back in once a decision has been made)  Haze Rushing, CPhT Pharmacy Patient Advocate Specialist Direct Number: [phone] Fax: [phone]

## 2022-06-26 NOTE — Telephone Encounter (Signed)
Pharmacy Patient Advocate Encounter  Received notification from [facility] that the request for prior authorization for [facility] has been denied due to COVERAGE RESTRICTED TO PATIENTS WITH DIABETES.     Haze Rushing, CPhT Pharmacy Patient Advocate Specialist Direct Number: [phone] Fax: [phone]

## 2022-07-01 ENCOUNTER — Other Ambulatory Visit (HOSPITAL_COMMUNITY): Payer: Self-pay

## 2022-07-01 NOTE — Telephone Encounter (Signed)
Yes, Corey Rose is also Plan Exclusion.

## 2022-07-02 NOTE — Telephone Encounter (Signed)
Patient aware meds not covered.  Can get cash price of $550-650/month.  If interested, he will call back.

## 2022-07-02 NOTE — Telephone Encounter (Signed)
LMOM for patient to return call.  Wegovy/Zepbound not covered on plan

## 2022-07-09 ENCOUNTER — Telehealth: Payer: Self-pay

## 2022-07-09 ENCOUNTER — Other Ambulatory Visit (HOSPITAL_COMMUNITY): Payer: Self-pay

## 2022-07-09 MED ORDER — ZEPBOUND 2.5 MG/0.5ML ~~LOC~~ SOAJ: 2.5000 mg | SUBCUTANEOUS | 1 refills | Status: DC

## 2022-07-09 MED ORDER — ZEPBOUND 5 MG/0.5ML ~~LOC~~ SOAJ: 5.0000 mg | SUBCUTANEOUS | 1 refills | Status: DC

## 2022-07-09 MED ORDER — ZEPBOUND 7.5 MG/0.5ML ~~LOC~~ SOAJ: 7.5000 mg | SUBCUTANEOUS | 1 refills | Status: DC

## 2022-07-09 MED ORDER — SILDENAFIL CITRATE 50 MG PO TABS: 50.0000 mg | ORAL_TABLET | Freq: Every day | ORAL | 0 refills | Status: DC | PRN

## 2022-07-09 NOTE — Telephone Encounter (Signed)
Refill sent in per Dr Shirlee Latch

## 2022-07-09 NOTE — Addendum Note (Signed)
Addended by: Rosalee Kaufman on: 07/09/2022 11:16 AM   Modules accepted: Orders

## 2022-07-09 NOTE — Telephone Encounter (Signed)
[facility] pharmacy is requesting a refill for sildenafil 50 mg tablets. This is a CHF pt. Please address

## 2022-07-09 NOTE — Telephone Encounter (Signed)
Patient would like to pay cash as not covered by insurance.  Will go with Zepbound, as it is cheaper than [place] for cash customers.

## 2022-07-23 ENCOUNTER — Ambulatory Visit: Payer: No Typology Code available for payment source | Attending: Cardiology

## 2022-07-23 DIAGNOSIS — I359 Nonrheumatic aortic valve disorder, unspecified: Secondary | ICD-10-CM

## 2022-07-23 DIAGNOSIS — Z7901 Long term (current) use of anticoagulants: Secondary | ICD-10-CM

## 2022-07-23 LAB — POCT INR: INR: 2.5 (ref 2.0–3.0)

## 2022-07-23 NOTE — Patient Instructions (Signed)
continue taking warfarin 2 tablets daily except for 1 tablet on Sundays and Wednesdays. Recheck INR in 4 weeks.. Coumadin Clinic [phone] or [phone]

## 2022-08-19 ENCOUNTER — Ambulatory Visit: Payer: No Typology Code available for payment source

## 2022-08-21 ENCOUNTER — Ambulatory Visit: Payer: No Typology Code available for payment source | Attending: Cardiovascular Disease

## 2022-08-21 DIAGNOSIS — Z7901 Long term (current) use of anticoagulants: Secondary | ICD-10-CM

## 2022-08-21 DIAGNOSIS — I359 Nonrheumatic aortic valve disorder, unspecified: Secondary | ICD-10-CM | POA: Diagnosis not present

## 2022-08-21 LAB — POCT INR: INR: 3.4 — AB (ref 2.0–3.0)

## 2022-08-21 NOTE — Patient Instructions (Signed)
Description   Skip today's dosage of Warfarin, then resume same dosage of Warfarin 2 tablets daily except for 1 tablet on Sundays and Wednesdays. Recheck INR in 3 weeks.. Coumadin Clinic [phone] or [phone]

## 2022-09-11 ENCOUNTER — Other Ambulatory Visit (HOSPITAL_COMMUNITY): Payer: Self-pay | Admitting: Cardiology

## 2022-09-11 ENCOUNTER — Ambulatory Visit: Payer: No Typology Code available for payment source | Attending: Cardiovascular Disease | Admitting: *Deleted

## 2022-09-11 DIAGNOSIS — Z7901 Long term (current) use of anticoagulants: Secondary | ICD-10-CM | POA: Diagnosis not present

## 2022-09-11 DIAGNOSIS — I359 Nonrheumatic aortic valve disorder, unspecified: Secondary | ICD-10-CM | POA: Diagnosis not present

## 2022-09-11 LAB — POCT INR: INR: 3.7 — AB (ref 2.0–3.0)

## 2022-09-11 NOTE — Patient Instructions (Addendum)
Description   Skip today's dosage of Warfarin, then START Warfarin 2 tablets daily except for 1 tablet on Sundays, Wednesdays, and Fridays. Recheck INR in 4 weeks per pt schedule. Coumadin Clinic [phone] or [phone]

## 2022-10-08 ENCOUNTER — Ambulatory Visit: Payer: No Typology Code available for payment source | Attending: Cardiology | Admitting: *Deleted

## 2022-10-08 DIAGNOSIS — I359 Nonrheumatic aortic valve disorder, unspecified: Secondary | ICD-10-CM

## 2022-10-08 DIAGNOSIS — Z7901 Long term (current) use of anticoagulants: Secondary | ICD-10-CM | POA: Diagnosis not present

## 2022-10-08 LAB — POCT INR: INR: 3 (ref 2.0–3.0)

## 2022-10-08 NOTE — Patient Instructions (Addendum)
Description   Skip today's dosage of Warfarin, then START Warfarin 1 tablet daily except for 2 tablets on Tuesday, Thursday, and Thursday. Recheck INR in 4 weeks per pt schedule. Coumadin Clinic [phone] or [phone]

## 2022-10-14 ENCOUNTER — Other Ambulatory Visit (HOSPITAL_COMMUNITY): Payer: Self-pay | Admitting: Cardiology

## 2022-10-28 ENCOUNTER — Telehealth: Payer: Self-pay | Admitting: Pharmacist Clinician (PhC)/ Clinical Pharmacy Specialist

## 2022-10-28 NOTE — Telephone Encounter (Signed)
Patient Corey Rose on my voice mail, had questions about next dose of Zepbound.  Currently has lost 33 pounds, down to 202 lb.  Will take 4th dose of Zepbound 7.5 mg in the next few days.  Wondering if he should continue to increase dose, stay at 7.5 or decrease.  With his current weight, his BMI is down to 25.1.    Returned call, reviewed his information.  Suggested that he stay at the 7.5 mg dose for 2-3 months, then taper back to 5 mg, should his weight continue to be steady.  Patient agreeable with plan, is seeing provider this week and will discuss with them as well

## 2022-11-04 IMAGING — CT CT ANGIO CHEST
1 series · 18 of 32 positions shown · IV contrast (APPLIED)
Comparison: Chest radiograph 10/24/2017 and chest MRA 02/07/2015

CLINICAL DATA: 44-year-old with history of bicuspid valve disorder
and status post aortic valve replacements. Complex surgery history
with history of TAVR procedure and removal of TAVR. Status post
Bentall procedure with mechanical aortic valve and Hemashield graft.

EXAM:
CT ANGIOGRAPHY CHEST WITH CONTRAST
TECHNIQUE: Multidetector CT imaging of the chest was performed using the
standard protocol during bolus administration of intravenous
contrast. Multiplanar CT image reconstructions and MIPs were
obtained to evaluate the vascular anatomy.
CONTRAST:  75mL MNQ2N0-X32 IOPAMIDOL (MNQ2N0-X32) INJECTION 76%

[Series 4: chest angio · axial · 0.83mm/px · z∈[-349,-25]mm · 18 of 116 slices shown]
[im 4/116  lung]
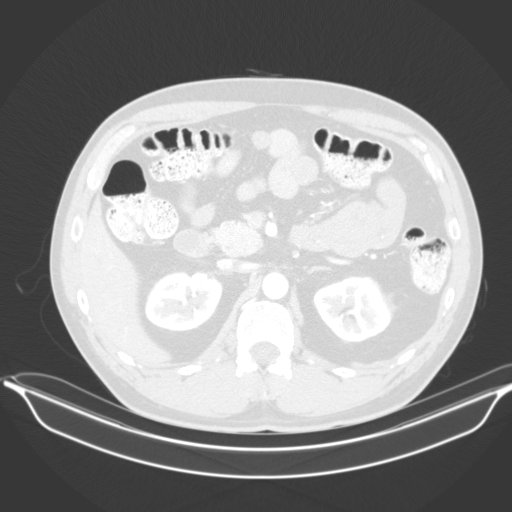
[im 12/116  soft-tissue]
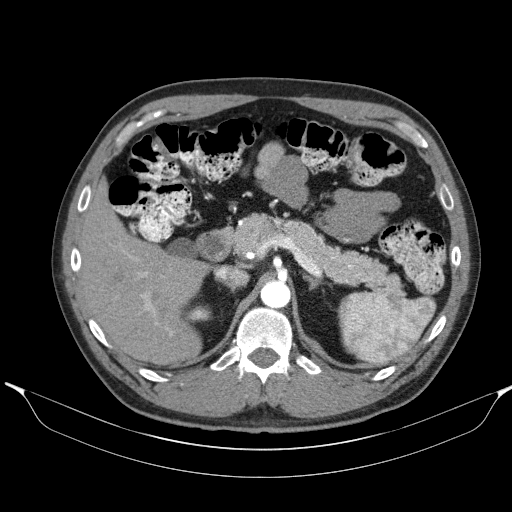
[im 19/116  lung]
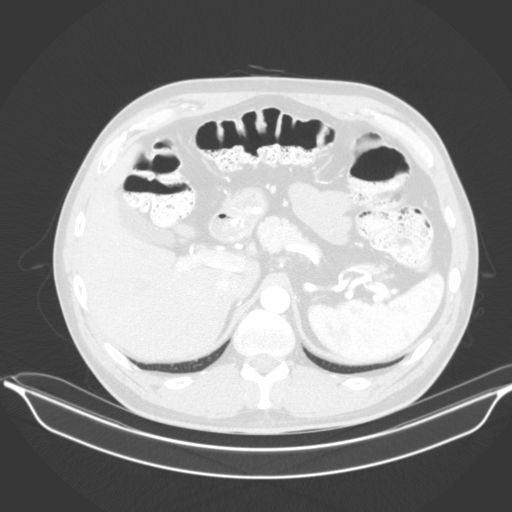
[im 23/116  soft-tissue]
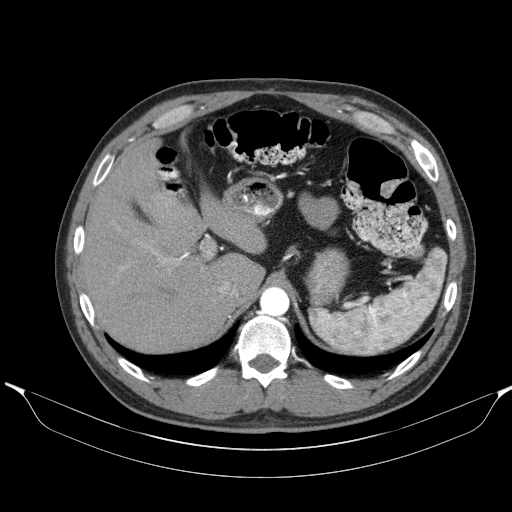
[im 30/116  lung]
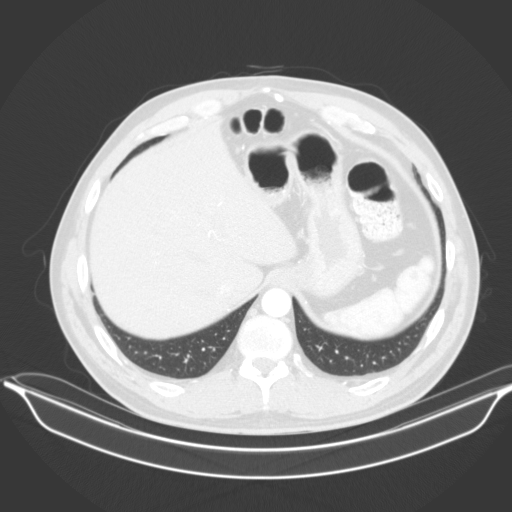
[im 38/116  soft-tissue]
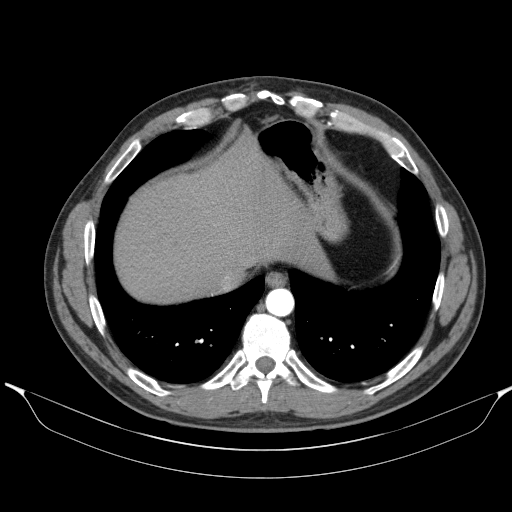
[im 41/116  lung]
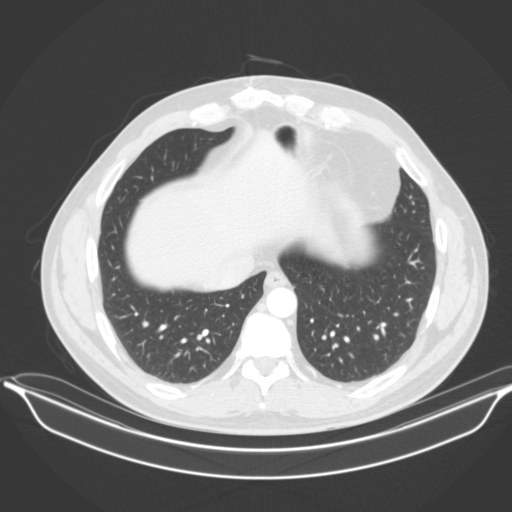
[im 49/116  soft-tissue]
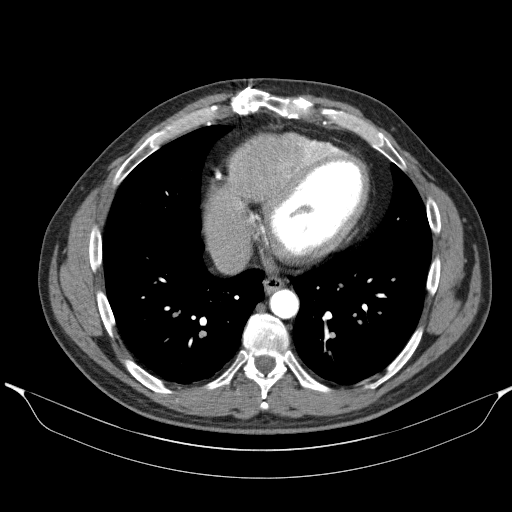
[im 56/116  lung]
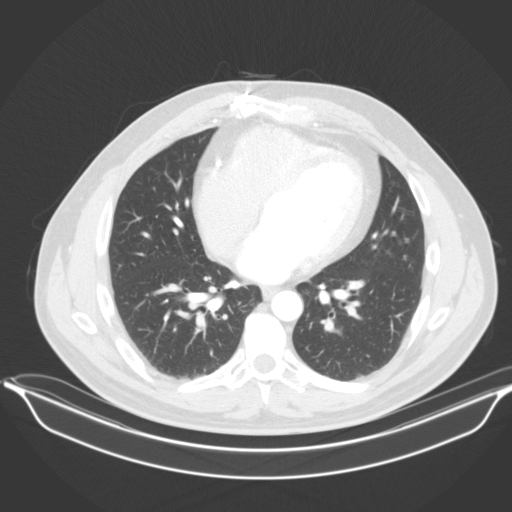
[im 60/116  soft-tissue]
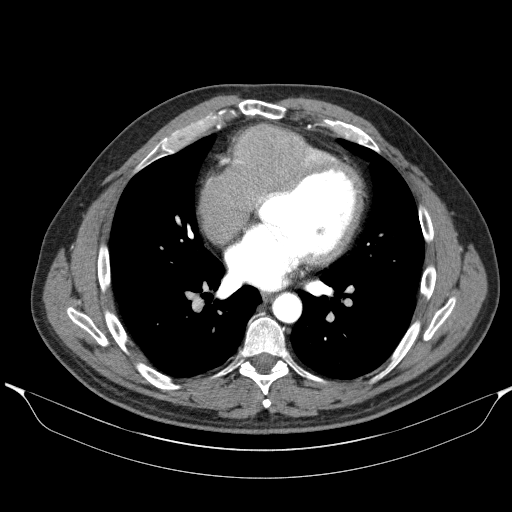
[im 67/116  lung]
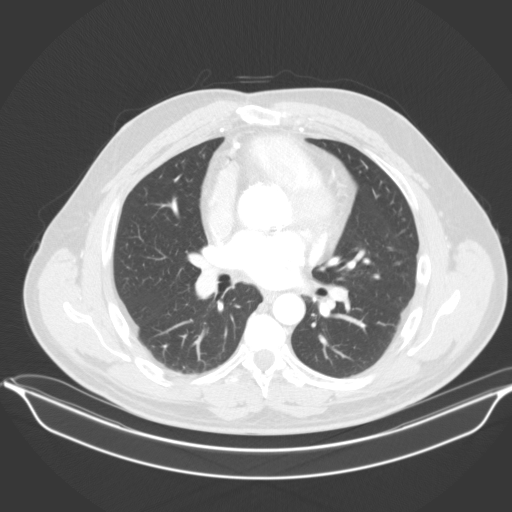
[im 75/116  soft-tissue]
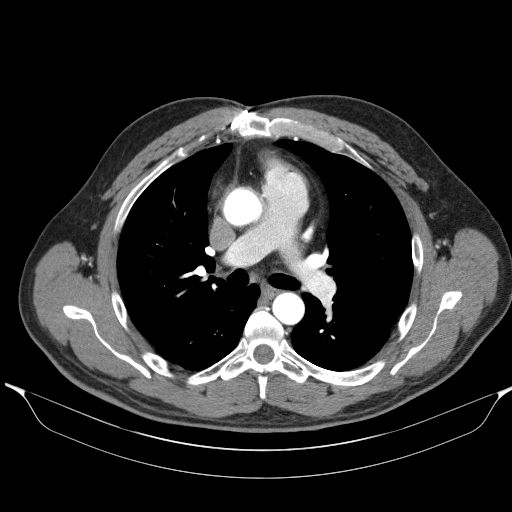
[im 78/116  lung]
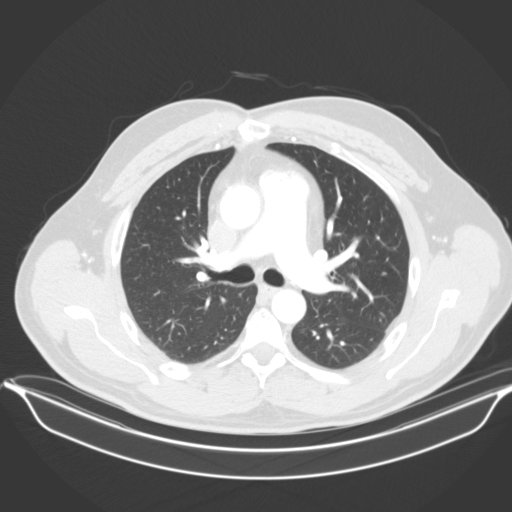
[im 86/116  soft-tissue]
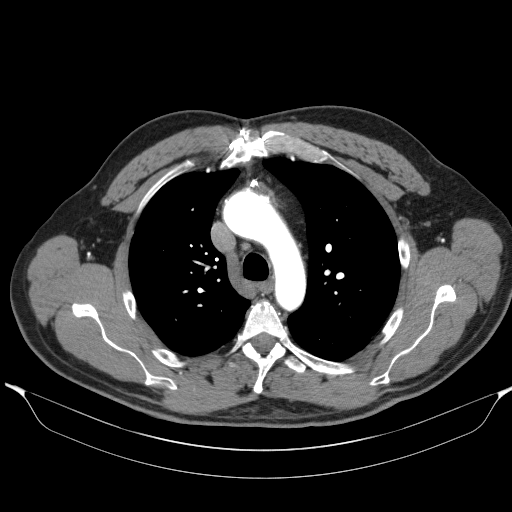
[im 93/116  lung]
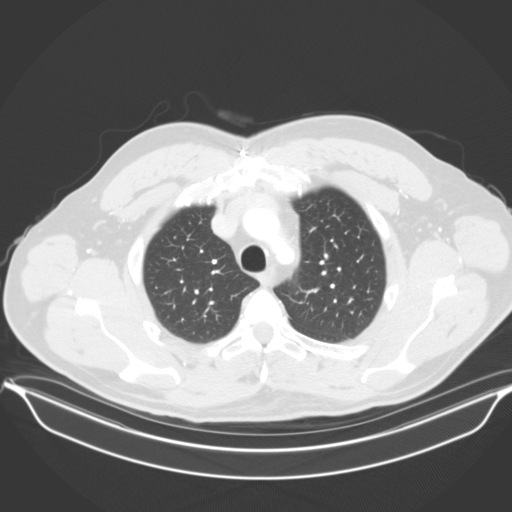
[im 97/116  soft-tissue]
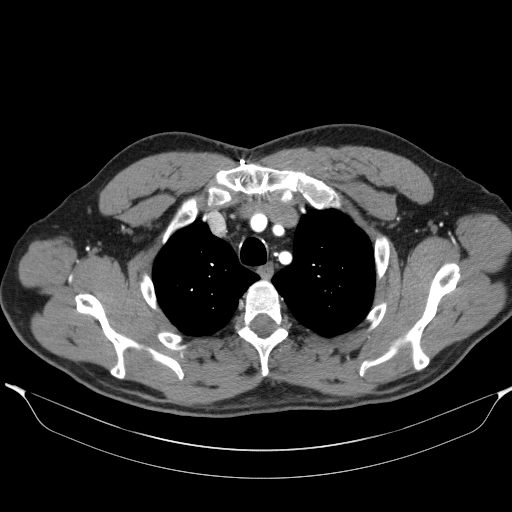
[im 104/116  lung]
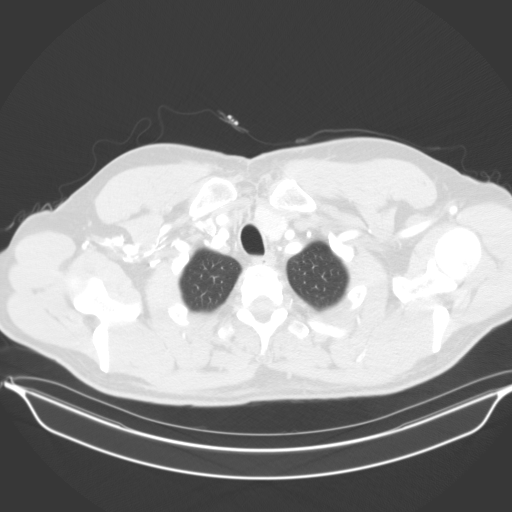
[im 112/116  soft-tissue]
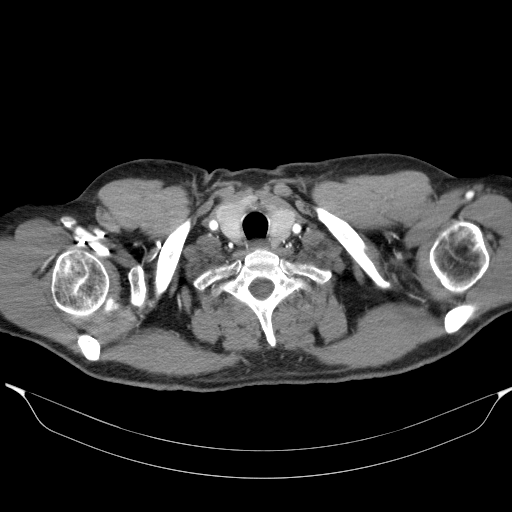

[18 of 32 positions shown; findings below may reference images not displayed]

FINDINGS: Cardiovascular: Mechanical aortic valve with replacement of the
ascending thoracic aorta and evidence for coronary artery
reimplantations. Although this is not an ECG-gated coronary
examination, there is flow in the left and right coronary arteries.
Aortic graft is widely patent without complicating features.
Proximal aortic arch measures 3.5 cm. There is no significant
enlargement of the thoracic aorta and no evidence for dissection.
Great vessels are patent. The left vertebral artery originates
directly from the aortic arch between the left common carotid artery
and left subclavian artery and this is a normal variant. Proximal
vertebral arteries are patent bilaterally. Right ventricle is
bulging anteriorly towards the sternum and this finding was similar
back in 8348. There is no significant pericardial fluid. No
significant contrast in the pulmonary arteries on this examination.
No gross abnormality involving the pulmonary veins or the left
atrial appendage. Mild narrowing of the celiac trunk origin likely
related to median arcuate ligament compression. SMA is patent with a
replaced right hepatic artery which is a normal variant.

Mediastinum/Nodes: No chest lymphadenopathy. Thyroid tissue is
slightly prominent. There are very small low-density nodules in left
thyroid lobe that do not meet criteria for ultrasound-guided
evaluation. No axillary lymph node enlargement.

Lungs/Pleura: Trachea and mainstem bronchi are patent. No pleural
effusions. 3 mm calcification in the lingula on sequence 13, image
95. No airspace disease or lung consolidation. Probable tiny
pleural-based calcification at the posterior left lung base on
sequence 13, image 151.

Upper Abdomen: No acute abnormality.

Musculoskeletal: Median sternotomy wires. No acute bone abnormality.

Review of the MIP images confirms the above findings.
IMPRESSION: 1. Postsurgical changes compatible with a Bentall procedure. No
complicating features. Aortic graft and thoracic aorta are widely
patent without dissection.
2. No acute abnormality in the chest.
3. Right ventricle is slightly dilated with an unusual configuration
but this is a chronic finding.

## 2022-11-04 IMAGING — CT CT PELVIS W/ CM
2 series · 13 of 32 positions shown, 19 images · IV contrast (APPLIED)
Comparison: None.

CLINICAL DATA: Right groin swelling

EXAM:
CT PELVIS WITH CONTRAST
TECHNIQUE: Multidetector CT imaging of the pelvis was performed using the
standard protocol following the bolus administration of intravenous
contrast.
CONTRAST:  75mL [id number] IOPAMIDOL (7SN2PU-NLT) INJECTION 76%

[Series 5: routine pelvis w/cm · axial · 0.89mm/px · z∈[-741,-486]mm · 10 of 63 slices shown, 16 images]
[im 6/63  soft-tissue]
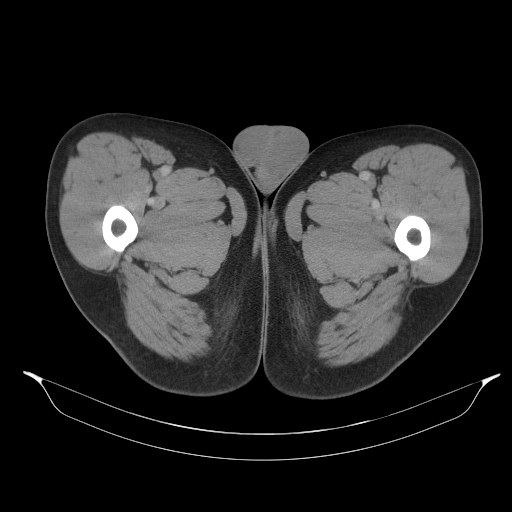
[im 6/63  bone]
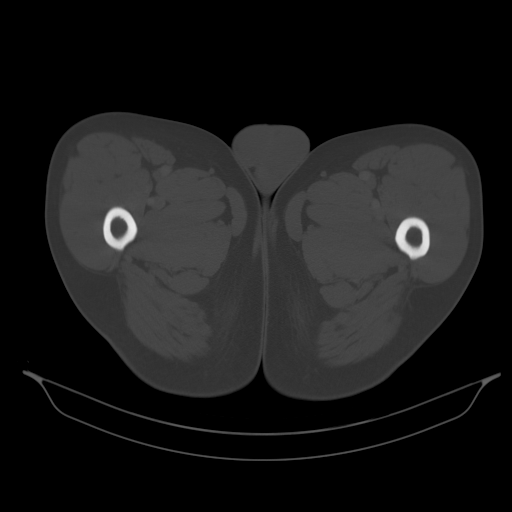
[im 12/63  soft-tissue]
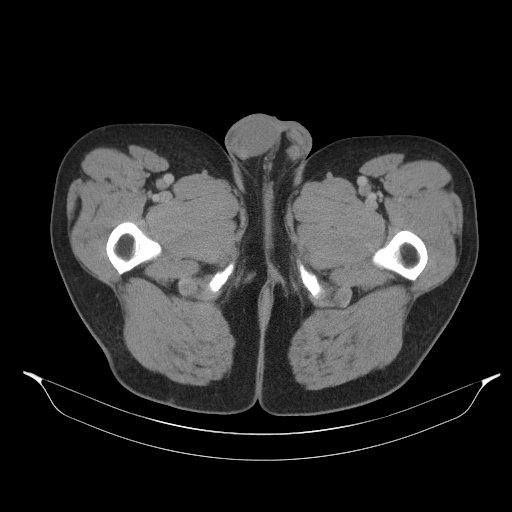
[im 17/63  soft-tissue]
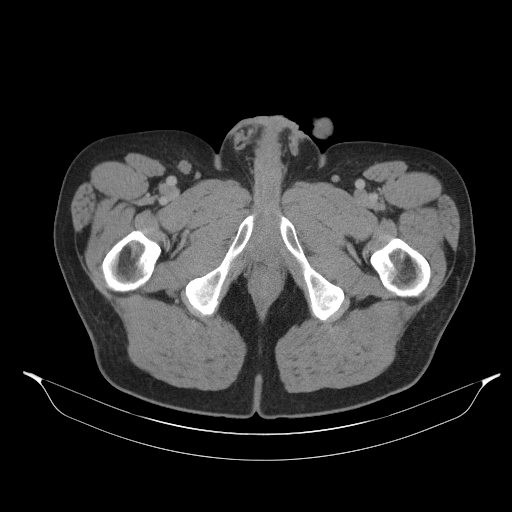
[im 23/63  soft-tissue]
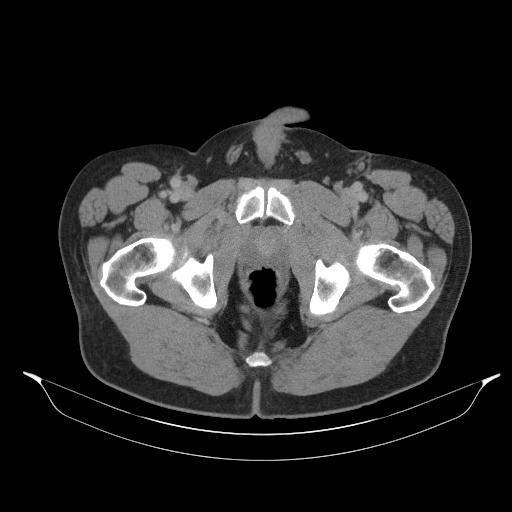
[im 29/63  soft-tissue]
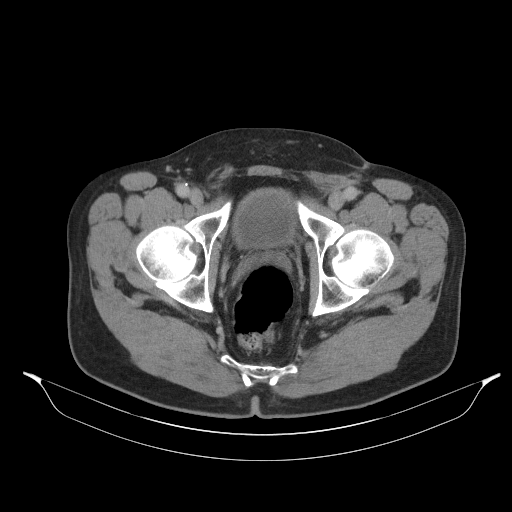
[im 34/63  soft-tissue]
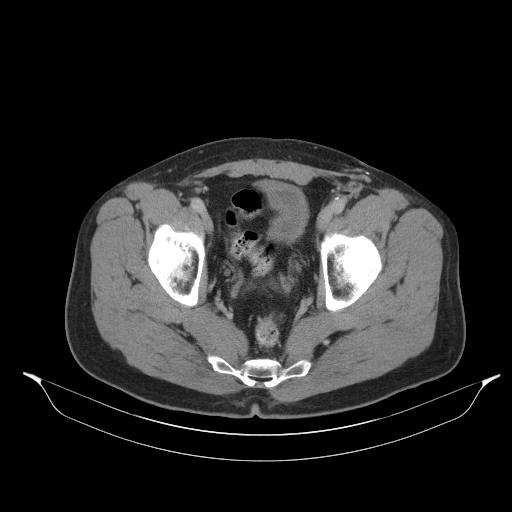
[im 40/63  soft-tissue]
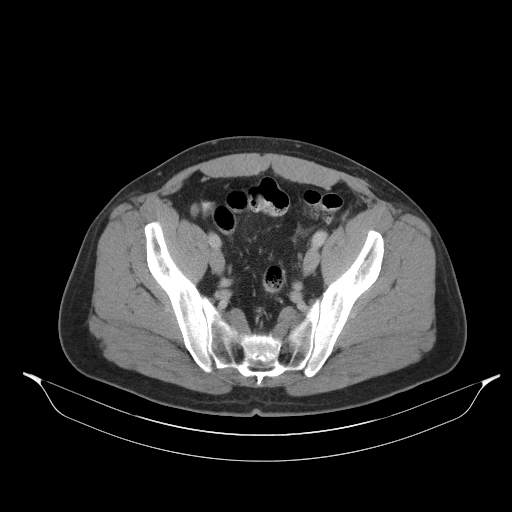
[im 40/63  lung]
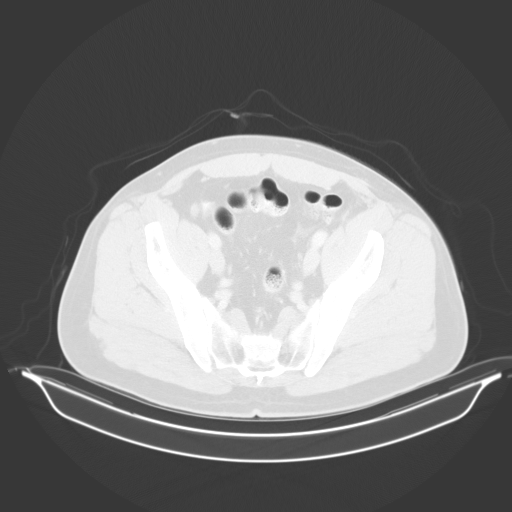
[im 46/63  soft-tissue]
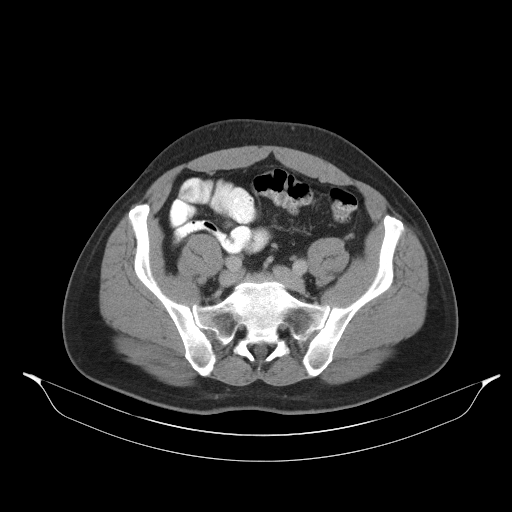
[im 46/63  lung]
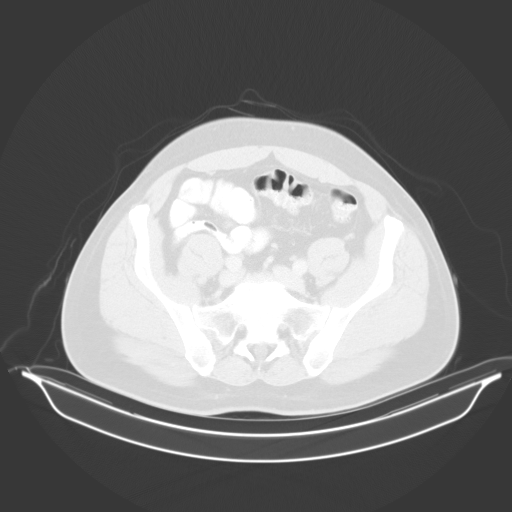
[im 51/63  soft-tissue]
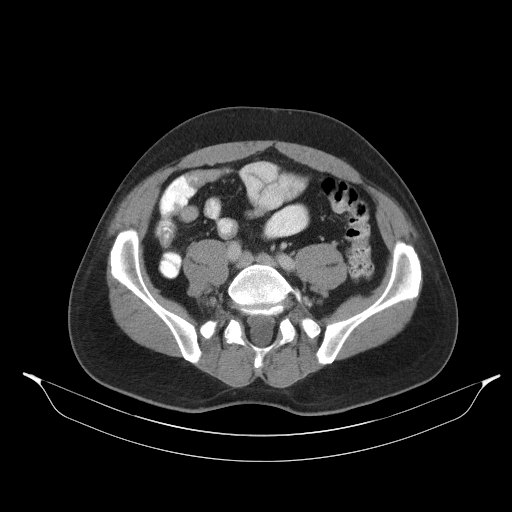
[im 51/63  lung]
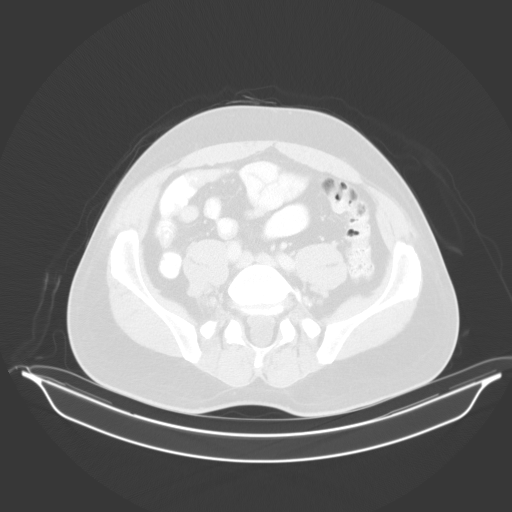
[im 51/63  bone]
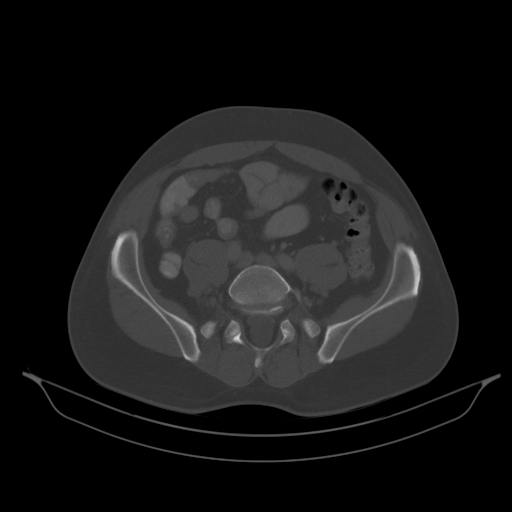
[im 57/63  soft-tissue]
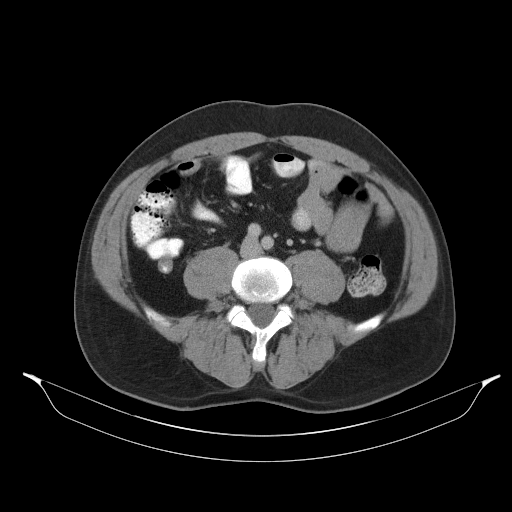
[im 57/63  lung]
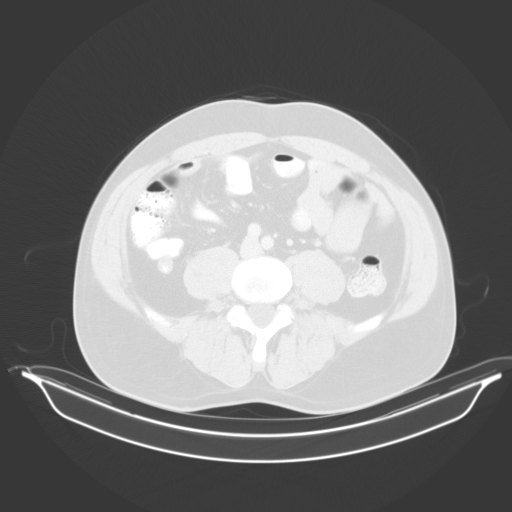

[Series 6: bone · axial · 0.89mm/px · z∈[-735,-669]mm · 3 of 104 slices shown]
[im 11/104  bone]
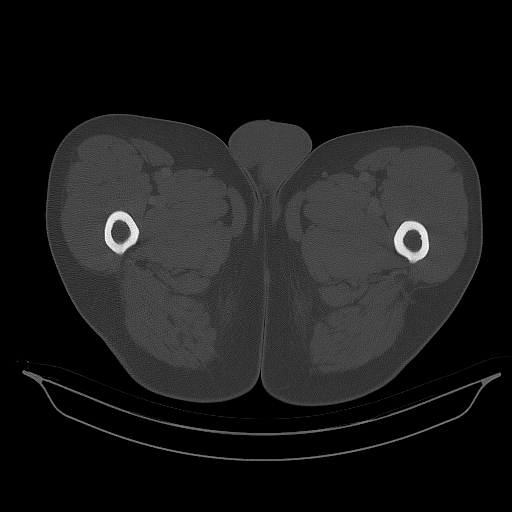
[im 22/104  bone]
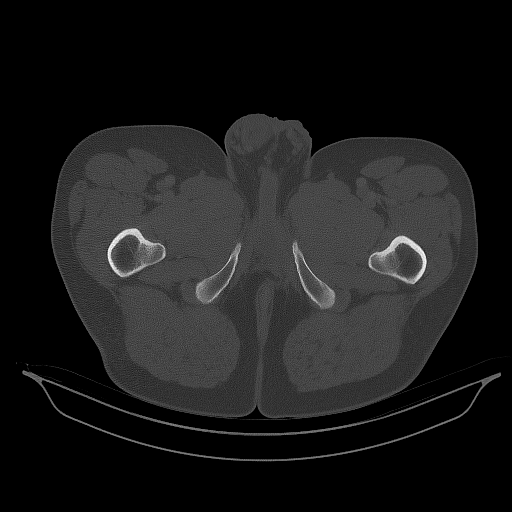
[im 33/104  bone]
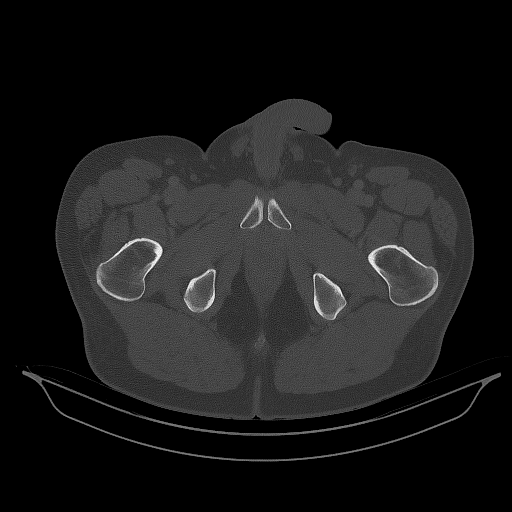

[13 of 32 positions shown; findings below may reference images not displayed]

FINDINGS: Urinary Tract:  No abnormality visualized.

Bowel:  Unremarkable visualized pelvic bowel loops.

Vascular/Lymphatic: No pathologically enlarged lymph nodes. No
significant vascular abnormality seen.

Reproductive:  The prostate gland is unremarkable.

Other: Healed surgical incisions are seen within the inguinal
regions bilaterally. No inguinal hernia identified. No subcutaneous
soft tissue mass or fluid collection identified within the inguinal
regions bilaterally. Tiny fat containing umbilical hernia.

Musculoskeletal: Bilateral L5 pars defects are present without
associated spondylolisthesis. No acute bone abnormality.
IMPRESSION: Postsurgical changes noted within the inguinal regions bilaterally.
No subcutaneous soft tissue mass, fluid collection, or inguinal
hernia identified.

Tiny fat containing umbilical hernia.

## 2022-11-07 ENCOUNTER — Ambulatory Visit: Payer: No Typology Code available for payment source | Attending: Cardiology | Admitting: *Deleted

## 2022-11-07 DIAGNOSIS — Z7901 Long term (current) use of anticoagulants: Secondary | ICD-10-CM | POA: Diagnosis not present

## 2022-11-07 DIAGNOSIS — I359 Nonrheumatic aortic valve disorder, unspecified: Secondary | ICD-10-CM | POA: Diagnosis not present

## 2022-11-07 LAB — POCT INR: INR: 3 (ref 2.0–3.0)

## 2022-11-07 NOTE — Patient Instructions (Addendum)
Description   Do not take any warfarin today then START taking Warfarin 1 tablet daily except for 2 tablets on Tuesday and Saturday. Recheck INR in 4 weeks per pt schedule. [facility] [phone] or [phone]

## 2022-11-12 ENCOUNTER — Other Ambulatory Visit (HOSPITAL_COMMUNITY): Payer: Self-pay | Admitting: Cardiology

## 2022-11-28 ENCOUNTER — Other Ambulatory Visit (HOSPITAL_COMMUNITY): Payer: Self-pay | Admitting: Cardiology

## 2022-11-28 ENCOUNTER — Other Ambulatory Visit: Payer: Self-pay | Admitting: Cardiology

## 2022-12-02 ENCOUNTER — Ambulatory Visit: Payer: No Typology Code available for payment source | Attending: Cardiology | Admitting: *Deleted

## 2022-12-02 DIAGNOSIS — I359 Nonrheumatic aortic valve disorder, unspecified: Secondary | ICD-10-CM | POA: Diagnosis not present

## 2022-12-02 DIAGNOSIS — Z7901 Long term (current) use of anticoagulants: Secondary | ICD-10-CM

## 2022-12-02 LAB — POCT INR: INR: 1.9 — AB (ref 2.0–3.0)

## 2022-12-02 NOTE — Patient Instructions (Addendum)
Description   Today take 1.5 tablets of warfarin then continue taking Warfarin 1 tablet daily except for 2 tablets on Tuesday and Saturday. Recheck INR in 3 weeks. Coumadin Clinic [phone] or [phone]

## 2022-12-09 ENCOUNTER — Ambulatory Visit (HOSPITAL_COMMUNITY)
Admission: RE | Admit: 2022-12-09 | Discharge: 2022-12-09 | Disposition: A | Discharge status: Home / Self Care | Payer: No Typology Code available for payment source | Source: Ambulatory Visit | Attending: Cardiology | Admitting: Cardiology

## 2022-12-09 ENCOUNTER — Inpatient Hospital Stay (HOSPITAL_COMMUNITY)
Admission: RE | Admit: 2022-12-09 | Discharge: 2022-12-09 | Disposition: A | Discharge status: Home / Self Care | Payer: No Typology Code available for payment source | Source: Ambulatory Visit | Attending: Cardiology | Admitting: Cardiology

## 2022-12-09 ENCOUNTER — Telehealth (HOSPITAL_COMMUNITY): Payer: Self-pay | Admitting: *Deleted

## 2022-12-09 ENCOUNTER — Other Ambulatory Visit (HOSPITAL_COMMUNITY): Payer: Self-pay | Admitting: Cardiology

## 2022-12-09 VITALS — BP 118/82 | HR 55 | Wt 206.6 lb

## 2022-12-09 DIAGNOSIS — R55 Syncope and collapse: Secondary | ICD-10-CM | POA: Insufficient documentation

## 2022-12-09 DIAGNOSIS — I359 Nonrheumatic aortic valve disorder, unspecified: Secondary | ICD-10-CM

## 2022-12-09 NOTE — Patient Instructions (Signed)
Your provider has recommended that  you wear a Zio Patch for 14 days.  This monitor will record your heart rhythm for our review.  IF you have any symptoms while wearing the monitor please press the button.  If you have any issues with the patch or you notice a red or orange light on it please call the company at [phone].  Once you remove the patch please mail it back to the company as soon as possible so we can get the results.    Follow up next Friday with Dr. Shirlee Latch at 8:40am

## 2022-12-09 NOTE — Progress Notes (Signed)
Patient seen today in clinic today as a nurse visit per Dr. Shirlee Latch for zio placement and EKG.   EKG completed, Live Zio placed for Syncope  Patient is scheduled for Echo this Thursday 12/5 at 1pm.   Patient scheduled for follow up on Friday 12/13 with Dr. Shirlee Latch.   Patient aware of all instructions and verbalized understanding.

## 2022-12-09 NOTE — Progress Notes (Signed)
Zio patch placed onto patient.  All instructions and information reviewed with patient, they verbalize understanding with no questions. 

## 2022-12-09 NOTE — Telephone Encounter (Signed)
Spoke w/pt, he will come in today at 2:45 for Zio placement, he is aware echo sch for Thur 12/5 at 1 pm

## 2022-12-09 NOTE — Telephone Encounter (Signed)
Per Dr Shirlee Latch: Pt with recent syncope, needs ASAP echo and Zio AT (live monitor) placed for 2 weeks, f/u with him next week    Echo order placed, will arrange

## 2022-12-12 ENCOUNTER — Other Ambulatory Visit (HOSPITAL_COMMUNITY): Payer: Self-pay | Admitting: Cardiology

## 2022-12-12 ENCOUNTER — Ambulatory Visit (HOSPITAL_COMMUNITY)
Admission: RE | Admit: 2022-12-12 | Discharge: 2022-12-12 | Disposition: A | Discharge status: Home / Self Care | Payer: PRIVATE HEALTH INSURANCE | Source: Ambulatory Visit | Attending: Internal Medicine | Admitting: Internal Medicine

## 2022-12-12 DIAGNOSIS — R55 Syncope and collapse: Secondary | ICD-10-CM | POA: Insufficient documentation

## 2022-12-12 DIAGNOSIS — I359 Nonrheumatic aortic valve disorder, unspecified: Secondary | ICD-10-CM | POA: Diagnosis present

## 2022-12-12 LAB — ECHOCARDIOGRAM COMPLETE
AR max vel: 2.04 cm2
AV Area VTI: 2 cm2
AV Area mean vel: 2.05 cm2
AV Mean grad: 5 mm[Hg]
AV Peak grad: 9 mm[Hg]
Ao pk vel: 1.5 m/s
Area-P 1/2: 3.31 cm2
Est EF: 50
MV M vel: 1.36 m/s
MV Peak grad: 7.4 mm[Hg]
S' Lateral: 5.33 cm

## 2022-12-12 NOTE — Telephone Encounter (Addendum)
Prescription refill request received for warfarin Lov: 09/26/2021 Next INR check: 12/27/2022 Warfarin tablet strength: warfarin 5mg     Scheduled to see cardiologist 12/20/2022

## 2022-12-16 ENCOUNTER — Telehealth (HOSPITAL_COMMUNITY): Payer: Self-pay | Admitting: *Deleted

## 2022-12-16 NOTE — Telephone Encounter (Signed)
Called patient per Dr. Shirlee Latch with following echo results:  "EF 50%, stable mechanical aortic valve.  No change from prior."  Pt verbalized understanding of same. No further questions at this time.

## 2022-12-20 ENCOUNTER — Encounter (HOSPITAL_COMMUNITY): Payer: Self-pay | Admitting: Cardiology

## 2022-12-20 ENCOUNTER — Ambulatory Visit (HOSPITAL_COMMUNITY)
Admission: RE | Admit: 2022-12-20 | Discharge: 2022-12-20 | Disposition: A | Discharge status: Home / Self Care | Payer: No Typology Code available for payment source | Source: Ambulatory Visit | Attending: Cardiology | Admitting: Cardiology

## 2022-12-20 VITALS — BP 108/78 | HR 65 | Wt 200.6 lb

## 2022-12-20 DIAGNOSIS — Z953 Presence of xenogenic heart valve: Secondary | ICD-10-CM | POA: Diagnosis not present

## 2022-12-20 DIAGNOSIS — I359 Nonrheumatic aortic valve disorder, unspecified: Secondary | ICD-10-CM | POA: Insufficient documentation

## 2022-12-20 DIAGNOSIS — Z7982 Long term (current) use of aspirin: Secondary | ICD-10-CM | POA: Insufficient documentation

## 2022-12-20 DIAGNOSIS — I5022 Chronic systolic (congestive) heart failure: Secondary | ICD-10-CM | POA: Diagnosis not present

## 2022-12-20 DIAGNOSIS — E785 Hyperlipidemia, unspecified: Secondary | ICD-10-CM | POA: Diagnosis not present

## 2022-12-20 DIAGNOSIS — Z79899 Other long term (current) drug therapy: Secondary | ICD-10-CM | POA: Diagnosis not present

## 2022-12-20 LAB — CBC
HCT: 40.3 % (ref 39.0–52.0)
Hemoglobin: 13.9 g/dL (ref 13.0–17.0)
MCH: 31 pg (ref 26.0–34.0)
MCHC: 34.5 g/dL (ref 30.0–36.0)
MCV: 89.8 fL (ref 80.0–100.0)
Platelets: 230 10*3/uL (ref 150–400)
RBC: 4.49 MIL/uL (ref 4.22–5.81)
RDW: 12.7 % (ref 11.5–15.5)
WBC: 5.6 10*3/uL (ref 4.0–10.5)
nRBC: 0 % (ref 0.0–0.2)

## 2022-12-20 LAB — BRAIN NATRIURETIC PEPTIDE: B Natriuretic Peptide: 106.5 pg/mL — ABNORMAL HIGH (ref 0.0–100.0)

## 2022-12-20 LAB — LIPID PANEL
Cholesterol: 102 mg/dL (ref 0–200)
HDL: 43 mg/dL (ref >40)
LDL Cholesterol: 40 mg/dL (ref 0–99)
Total CHOL/HDL Ratio: 2.4 {ratio}
Triglycerides: 94 mg/dL (ref <150)
VLDL: 19 mg/dL (ref 0–40)

## 2022-12-20 LAB — BASIC METABOLIC PANEL
Anion gap: 6 (ref 5–15)
BUN: 14 mg/dL (ref 6–20)
CO2: 25 mmol/L (ref 22–32)
Calcium: 9.2 mg/dL (ref 8.9–10.3)
Chloride: 107 mmol/L (ref 98–111)
Creatinine, Ser: 1.17 mg/dL (ref 0.61–1.24)
GFR, Estimated: >60 mL/min (ref >60)
Glucose, Bld: 96 mg/dL (ref 70–99)
Potassium: 4.4 mmol/L (ref 3.5–5.1)
Sodium: 138 mmol/L (ref 135–145)

## 2022-12-20 MED ORDER — VALSARTAN 40 MG PO TABS: 20.0000 mg | ORAL_TABLET | Freq: Every day | ORAL | 3 refills | Status: DC

## 2022-12-20 NOTE — Patient Instructions (Addendum)
Medication Changes:  STOP ENTRESTO   START: VALSARTAN 20MG  (1/2) TABLET ONCE DAILY   Lab Work:  Labs done today, your results will be available in MyChart, we will contact you for abnormal readings.  PLEASE MAIL BACK ZIO PATCH   Follow-Up in: 6 MONTHS WITH DR. Shirlee Latch PLEASE CALL OUR OFFICE AROUND APRIL 2025 TO GET SCHEDULED FOR YOUR APPOINTMENT. PHONE NUMBER IS [phone] OPTION 2   At the [facility], you and your health needs are our priority. We have a designated team specialized in the treatment of Heart Failure. This Care Team includes your primary Heart Failure Specialized Cardiologist (physician), Advanced Practice Providers (APPs- Physician Assistants and Nurse Practitioners), and Pharmacist who all work together to provide you with the care you need, when you need it.   You may see any of the following providers on your designated Care Team at your next follow up:  Dr. Arvilla Meres Dr. Marca Ancona Dr. Dorthula Nettles Dr. Theresia Bough Tonye Becket, NP Robbie Lis, [place] [facility] [place], [place] Brynda Peon, NP [place] Lee, NP Karle Plumber, PharmD   Please be sure to bring in all your medications bottles to every appointment.   Need to Contact us:  If you have any questions or concerns before your next appointment please send [place] a message through [place] or call our office at [phone].    TO LEAVE A MESSAGE FOR THE NURSE SELECT OPTION 2, PLEASE LEAVE A MESSAGE INCLUDING: YOUR NAME DATE OF BIRTH CALL BACK NUMBER REASON FOR CALL**this is important as we prioritize the call backs  YOU WILL RECEIVE A CALL BACK THE SAME DAY AS LONG AS YOU CALL BEFORE 4:00 PM

## 2022-12-21 NOTE — Progress Notes (Signed)
Patient ID: Corey NICOLOSI, male   DOB: 03/17/1975, 47 y.o.   MRN: [id number] PCP: Dr. Waynard Edwards Cardiology: Dr. Shirlee Latch  47 y.o.with history of bicuspid valve disorder s/p AVR x 3 presents for followup of aortic valve disorder.  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  He had a homograft aortic valve placed at that time.  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  He had repeat AVR with a Freestyle Medtronic bioprosthetic valve in 2009. Both initial AVRs were at [facility] (Dr. Nevada Crane).    In 2019, he was noted to have a louder diastolic murmur and significant aortic insufficiency was noted.  He was seen again at [facility], and decision was made to undergo TAVR.  Initial TAVR valve was undersized and migrated.  A wall stent was used to crush this valve against the wall of the aortic root, leading to severe AI.  The patient developed flash pulmonary edema and was put on ECMO for several days. After wall stent was placed, a Medtronic Corevalve was deployed in the aortic position.  The patient was taken off ECMO.  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 He underwent extensive rehab at the [facility] in [place] and seems to have recovered nearly fully from strokes.   Repeat TEE in 8/19 showed severe peri-valvular aortic insufficiency around the Medtronic Corevalve prosthesis with preserved EF.   Patient was then seen at the [facility].  He had a 3rd sternotomy with removal of the 2 TAVR valves and wall stent, Bentall procedure with On-X mechanical aortic valve/28 mm Hemashield graft and reimplantation of the coronaries.    Echo in 9/19 showed EF 50-55% (low normal to mildly decreased), mildly dilated RV with mildly decreased systolic function, normal mechanical aortic valve.  Repeat echo in 6/20 showed stable EF 50-55%, mild LV dilation, mild RV dilation with normal systolic function, normal  mechanical aortic valve.   Echo in 3/22 showed EF 50% with mild LV dilation, normal RV, On-X mechanical aortic valve functions normally.   Patient had a syncopal episode 12/08/22.  He stood up and transiently passed out, falling onto the edge of a desk.  He reports that he had been having worsening lightheadedness with standing for several months prior, coinciding with significant weight loss on tirzepatide.  Sherryll Burger was stopped.  Echo in 12/24 showed EF 50%, mild LV dilation, mild RV enlargement with normal RV systolic function, On-X mechanical aortic valve with mean gradient 5 mmHg and no AI.   He returns today for followup of mechanical AVR and recent syncope.  Since stopping Entresto, he has had no further orthostatic symptoms or syncope.  No exertional dyspnea or chest pain.  Able to run up to 5-6 miles with no problems.  No orthopnea/PND.  No BRBPR/melena. Weight down 15 lbs since last appointment. He is still wearing Zio monitor.   ECG (personally reviewed): NSR, nonspecific T wave changes  Labs (12/13): K 4.5, creatinine 0.9, HDL 41, LDL 194, TSH normal Labs (4/09): LDL 111, HDL 44  Labs (9/19): K 4.2, creatinine 0.94, hgb 10.2 Labs (1/20): LDL 81 Labs (6/20): K 4.1, creatinine 1.18 Labs (11/21): LDL 98, HDL 54 Labs (4/22): K 4.5, creatinine 1.11 Labs (2/24): LDL 58  PMH: 1. Hyperlipidemia: Myalgias with simvastatin. 2. 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  He developed an  aortic root pseudoaneurysm and had repeat valve replacement with 29 mm Medtronic Freestyle bioprosthetic aortic valve in 2009. AVRs were both at [facility], Dr. Nevada Crane.  Echo (1/14) with EF 55-60%, bioprosthetic aortic valve with mean gradient 5 mmHg.  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  Echo (3/16) with EF 55-60%, bioprosthetic aortic valve with mean gradient 4 mmHg and trivial AI, normal RV size and systolic function.  - Echo (4/18):  EF 60-65%, well-seated bioprosthetic aortic valve with no stenosis, mild regurgitation.  - MRA chest (1/17) with 4.3 cm ascending aorta.  - Development of severe AI in 2019 => he had a complicated procedure in 6/19 with failed initial TAVR with migration of valve followed by wall stent followed Medtronic Corevalve.   - TAVR valve noted to have severe peri-valvular regurgitation on 8/19 TEE.  - In 9/19, He had a 3rd sternotomy with removal of the 2 TAVR valves and wall stent, Bentall procedure with On-X mechanical aortic valve/28 mm Hemashield graft and reimplantation of the coronaries.  - Echo (9/19) showed EF 50-55% (low normal to mildly decreased), mildly dilated RV with mildly decreased systolic function, normal mechanical aortic valve.  - Echo (6/20) showed stable EF 50-55%, mild LV dilation, mild RV dilation with normal systolic function, normal mechanical aortic valve (On-X).   - Echo (3/22): EF 50% with mild LV dilation, normal RV, On-X mechanical aortic valve functions normally.  - CTA chest (6/22): Stable post-op Bentall - Echo (12/24): EF 50%, mild LV dilation, mild RV enlargement with normal RV systolic function, On-X mechanical aortic valve with mean gradient 5 mmHg and no AI. 3. H/o prostatitis 4. H/o bladder stones 5. CVAs: Peri-operatively with failed TAVR in 6/19.  6. PVCs 7. Syncope (12/24) thought to be due to orthostatic hypotension.   SH: Married, 2 children, lives in [place].  Owns [employer]   FH: Father with atrial fibrillation, grandfather with "heart disease."   ROS: All systems reviewed and negative except as per HPI.   Current Outpatient Medications  Medication Sig Dispense Refill   Apoaequorin (PREVAGEN PO) Take by mouth.     aspirin 81 MG tablet Take 1 tablet (81 mg total) by mouth daily. 30 tablet 3   escitalopram (LEXAPRO) 10 MG tablet Take 20 mg by mouth daily.      Melatonin 3 MG TABS Take 6 mg by mouth at bedtime.     metoprolol succinate (TOPROL-XL) 25 MG  24 hr tablet Take 1 tablet (25 mg total) by mouth at bedtime. NEEDS FOLLOW UP APPOINTMENT FOR MORE REFILLS 180 tablet 0   mirtazapine (REMERON) 7.5 MG tablet Take 7.5 mg by mouth at bedtime.     rosuvastatin (CRESTOR) 40 MG tablet Take 1 tablet (40 mg total) by mouth daily. NEEDS FOLLOW UP APPOINTMENT FOR MORE REFILLS 90 tablet 1   sildenafil (VIAGRA) 50 MG tablet TAKE 1 TABLET(50 MG) BY MOUTH DAILY AS NEEDED FOR ERECTILE DYSFUNCTION 10 tablet 0   Tamsulosin HCl (FLOMAX) 0.4 MG CAPS Take 0.4 mg by mouth daily.     valsartan (DIOVAN) 40 MG tablet Take 0.5 tablets (20 mg total) by mouth daily. 45 tablet 3   warfarin (COUMADIN) 5 MG tablet Take 1 to 2 tablets by mouth daily as directed by the coumadin clinic. 45 tablet 0   ZEPBOUND 7.5 MG/0.5ML Pen INJECT 7.5MG  IN SKIN ONCE A WEEK 2 mL 1   No current facility-administered medications for this encounter.   BP 108/78   Pulse 65   Wt  91 kg (200 lb 9.6 oz)   SpO2 99%   BMI 25.07 kg/m  General: NAD Neck: No JVD, no thyromegaly or thyroid nodule.  Lungs: Clear to auscultation bilaterally with normal respiratory effort. CV: Nondisplaced PMI.  Heart regular S1/S2 with mechanical S2, no S3/S4, 1/6 SEM RUSB.  No peripheral edema.  No carotid bruit.  Normal pedal pulses.  Abdomen: Soft, nontender, no hepatosplenomegaly, no distention.  Skin: Intact without lesions or rashes.  Neurologic: Alert and oriented x 3.  Psych: Normal affect. Extremities: No clubbing or cyanosis.  HEENT: Normal.   Assessment/Plan: 1. Bicuspid aortic valve disorder: Patient had bicuspid aortic valve and developed endocarditis.  He had AVR initially in 1997 then developed a pseudoaneurysm of the aortic root and repeat AVR with a bioprosthetic valve in 2009.  2019 developed severe AI, had failed TAVR procedure in 6/19 followed by Bentall procedure with On-X mechanical aortic valve/28 mm Hemashield graft and coronary reimplantation in 9/19.   CTA chest showed stable ascending  aorta in 6/22.  Echo in 12/24 showed stable mechanical aortic valve.  - Needs antibiotic prophylaxis with dental procedures.  - Patient has an On-X mechanical valve. Based on data from PROACT trial, he could decrease INR goal to 1.5-2 long-term.  He has had no bleeding issues, will keep his goal 2-2.5 for now.   - Continue ASA 81 daily.   - CBC today.  2. Hyperlipidemia: He is on Crestor 40 mg daily. Check lipids today.  3. CVAs: Peri-operatively with failed TAVR.  He has recovered back to his baseline.  4. PVCs: No palpitations recently.  5. Chronic HF with mid-range EF: Echo in 12/24 was similar to priors, with mildly dilated LV and EF 50%.   - Continue Toprol XL 25 mg daily.   - He will stay off Entresto, was stopped due to orthostatic symptoms/syncope which have resolved.  He will start valsartan 20 mg at bedtime. BMET today.  6. Syncope: in 12/24.  Suspect due to orthostatic hypotension with Entresto use, Viagra use, and significant weight loss on tirzepatide.  Orthostatic symptoms have resolved off Entresto. Echo in 12/24 showed no changes from prior.  - As above, he will stay off Entresto and will use low dose valsartan instead.  - He is wearing a 2 wk Zio monitor to rule out arrhythmias, I think unlikely.   Followup in 6 months.   Marca Ancona 12/21/2022

## 2022-12-27 ENCOUNTER — Ambulatory Visit: Payer: No Typology Code available for payment source | Attending: Cardiovascular Disease | Admitting: *Deleted

## 2022-12-27 DIAGNOSIS — I359 Nonrheumatic aortic valve disorder, unspecified: Secondary | ICD-10-CM | POA: Diagnosis not present

## 2022-12-27 DIAGNOSIS — Z7901 Long term (current) use of anticoagulants: Secondary | ICD-10-CM | POA: Diagnosis not present

## 2022-12-27 LAB — POCT INR: INR: 2.7 (ref 2.0–3.0)

## 2022-12-27 NOTE — Patient Instructions (Signed)
Description   Today have a large green vegetable and then continue taking Warfarin 1 tablet daily except for 2 tablets on Tuesday and Saturday. Recheck INR in 4 weeks. Coumadin Clinic [phone] or [phone]

## 2023-01-03 NOTE — Addendum Note (Signed)
Encounter addended by: Crissie Figures, RN on: 01/03/2023 10:20 AM  Actions taken: Imaging Exam ended

## 2023-01-09 ENCOUNTER — Telehealth (HOSPITAL_COMMUNITY): Payer: Self-pay

## 2023-01-09 NOTE — Telephone Encounter (Addendum)
 Pt aware, agreeable, and verbalized understanding   ----- Message from Marca Ancona sent at 01/04/2023 12:30 PM EST ----- No worrisome arrhythmias.

## 2023-01-10 ENCOUNTER — Other Ambulatory Visit (HOSPITAL_COMMUNITY): Payer: Self-pay | Admitting: Cardiology

## 2023-01-21 ENCOUNTER — Ambulatory Visit: Payer: No Typology Code available for payment source | Attending: Cardiology

## 2023-01-21 DIAGNOSIS — Z7901 Long term (current) use of anticoagulants: Secondary | ICD-10-CM

## 2023-01-21 DIAGNOSIS — I359 Nonrheumatic aortic valve disorder, unspecified: Secondary | ICD-10-CM

## 2023-01-21 LAB — POCT INR: INR: 2.5 (ref 2.0–3.0)

## 2023-01-21 NOTE — Patient Instructions (Signed)
 continue taking Warfarin 1 tablet daily except for 2 tablets on Tuesday and Saturday. Recheck INR in 6 weeks. Coumadin Clinic [phone] or [phone]

## 2023-02-10 ENCOUNTER — Other Ambulatory Visit (HOSPITAL_COMMUNITY): Payer: Self-pay | Admitting: Cardiology

## 2023-02-10 DIAGNOSIS — I359 Nonrheumatic aortic valve disorder, unspecified: Secondary | ICD-10-CM

## 2023-02-11 NOTE — Telephone Encounter (Signed)
Warfarin 5mg  refill Dx Aortic valve disorder Last INR 01/21/23 Last OV 12/20/22

## 2023-03-04 ENCOUNTER — Ambulatory Visit: Payer: PRIVATE HEALTH INSURANCE | Attending: Cardiology

## 2023-03-04 DIAGNOSIS — I359 Nonrheumatic aortic valve disorder, unspecified: Secondary | ICD-10-CM | POA: Diagnosis not present

## 2023-03-04 DIAGNOSIS — Z7901 Long term (current) use of anticoagulants: Secondary | ICD-10-CM

## 2023-03-04 LAB — POCT INR: INR: 2.5 (ref 2.0–3.0)

## 2023-03-04 NOTE — Patient Instructions (Signed)
 continue taking Warfarin 1 tablet daily except for 2 tablets on Tuesday and Saturday. Recheck INR in 6 weeks. Coumadin Clinic [phone] or [phone]

## 2023-03-16 ENCOUNTER — Other Ambulatory Visit (HOSPITAL_COMMUNITY): Payer: Self-pay | Admitting: Cardiology

## 2023-04-15 ENCOUNTER — Ambulatory Visit: Payer: PRIVATE HEALTH INSURANCE | Attending: Cardiology | Admitting: *Deleted

## 2023-04-15 DIAGNOSIS — I359 Nonrheumatic aortic valve disorder, unspecified: Secondary | ICD-10-CM

## 2023-04-15 DIAGNOSIS — Z7901 Long term (current) use of anticoagulants: Secondary | ICD-10-CM

## 2023-04-15 LAB — POCT INR: INR: 2.4 (ref 2.0–3.0)

## 2023-04-15 NOTE — Patient Instructions (Signed)
 Description   Continue taking Warfarin 1 tablet daily except for 2 tablets on Tuesday and Saturday. Recheck INR in 6 weeks. Coumadin Clinic [phone] or [phone]

## 2023-05-27 ENCOUNTER — Encounter

## 2023-05-27 ENCOUNTER — Ambulatory Visit: Attending: Cardiology | Admitting: *Deleted

## 2023-05-27 DIAGNOSIS — I359 Nonrheumatic aortic valve disorder, unspecified: Secondary | ICD-10-CM

## 2023-05-27 DIAGNOSIS — Z7901 Long term (current) use of anticoagulants: Secondary | ICD-10-CM | POA: Diagnosis not present

## 2023-05-27 LAB — POCT INR: INR: 2.8 (ref 2.0–3.0)

## 2023-05-27 NOTE — Patient Instructions (Addendum)
 Description   Today take 1 tablet of warfarin then continue taking Warfarin 1 tablet daily except for 2 tablets on Tuesday and Saturday. Recheck INR in 6 weeks. Coumadin  Clinic  [phone]

## 2023-06-04 ENCOUNTER — Other Ambulatory Visit (HOSPITAL_COMMUNITY): Payer: Self-pay | Admitting: Cardiology

## 2023-06-11 ENCOUNTER — Other Ambulatory Visit (HOSPITAL_COMMUNITY): Payer: Self-pay | Admitting: Cardiology

## 2023-06-11 DIAGNOSIS — I359 Nonrheumatic aortic valve disorder, unspecified: Secondary | ICD-10-CM

## 2023-06-12 NOTE — Telephone Encounter (Signed)
 Prescription refill request received for warfarin Lov: Corey Rose 12/20/2022 Next INR check: 7/1  Warfarin tablet strength: 5mg 

## 2023-06-25 ENCOUNTER — Telehealth: Payer: Self-pay | Admitting: Pharmacist

## 2023-06-25 DIAGNOSIS — E663 Overweight: Secondary | ICD-10-CM

## 2023-07-08 ENCOUNTER — Ambulatory Visit: Attending: Cardiology

## 2023-07-08 DIAGNOSIS — I359 Nonrheumatic aortic valve disorder, unspecified: Secondary | ICD-10-CM

## 2023-07-08 DIAGNOSIS — Z7901 Long term (current) use of anticoagulants: Secondary | ICD-10-CM | POA: Diagnosis not present

## 2023-07-08 LAB — POCT INR: INR: 2.3 (ref 2.0–3.0)

## 2023-07-08 NOTE — Patient Instructions (Signed)
 continue taking Warfarin 1 tablet daily except for 2 tablets on Tuesday and Saturday. Recheck INR in 6 weeks. Coumadin  Clinic  [phone]

## 2023-07-08 NOTE — Progress Notes (Signed)
Please see anticoagulation encounter.

## 2023-07-23 ENCOUNTER — Other Ambulatory Visit: Payer: Self-pay | Admitting: Pharmacist Clinician (PhC)/ Clinical Pharmacy Specialist

## 2023-07-23 MED ORDER — ZEPBOUND 7.5 MG/0.5ML ~~LOC~~ SOAJ
7.5000 mg | SUBCUTANEOUS | 0 refills | Status: DC
Start: 2023-07-23 — End: 2023-08-25

## 2023-07-25 ENCOUNTER — Other Ambulatory Visit (HOSPITAL_COMMUNITY): Payer: Self-pay | Admitting: Cardiology

## 2023-08-18 ENCOUNTER — Other Ambulatory Visit: Payer: Self-pay | Admitting: Cardiology

## 2023-08-19 ENCOUNTER — Ambulatory Visit: Attending: Cardiology

## 2023-08-19 DIAGNOSIS — Z7901 Long term (current) use of anticoagulants: Secondary | ICD-10-CM

## 2023-08-19 DIAGNOSIS — I359 Nonrheumatic aortic valve disorder, unspecified: Secondary | ICD-10-CM

## 2023-08-19 LAB — POCT INR: INR: 2.3 (ref 2.0–3.0)

## 2023-08-19 NOTE — Patient Instructions (Signed)
 continue taking Warfarin 1 tablet daily except for 2 tablets on Tuesday and Saturday. Recheck INR in 6 weeks. Coumadin  Clinic  [phone]

## 2023-08-19 NOTE — Progress Notes (Signed)
 INR-2.3; Please see anticoagulation encounter

## 2023-09-17 ENCOUNTER — Other Ambulatory Visit (HOSPITAL_COMMUNITY): Payer: Self-pay | Admitting: Cardiology

## 2023-09-17 DIAGNOSIS — I359 Nonrheumatic aortic valve disorder, unspecified: Secondary | ICD-10-CM

## 2023-09-30 ENCOUNTER — Ambulatory Visit: Attending: Cardiology | Admitting: *Deleted

## 2023-09-30 DIAGNOSIS — Z7901 Long term (current) use of anticoagulants: Secondary | ICD-10-CM | POA: Diagnosis not present

## 2023-09-30 DIAGNOSIS — I359 Nonrheumatic aortic valve disorder, unspecified: Secondary | ICD-10-CM

## 2023-09-30 LAB — POCT INR: INR: 2.5 (ref 2.0–3.0)

## 2023-09-30 NOTE — Patient Instructions (Addendum)
 Description   DO NOT PRINT INR-2.5; Continue taking Warfarin 1 tablet daily except for 2 tablets on Tuesday and Saturday. Recheck INR in 6 weeks. Coumadin  Clinic  [phone]

## 2023-09-30 NOTE — Progress Notes (Signed)
 Description   DO NOT PRINT INR-2.5; Continue taking Warfarin 1 tablet daily except for 2 tablets on Tuesday and Saturday. Recheck INR in 6 weeks. Coumadin  Clinic  [phone]

## 2023-10-14 ENCOUNTER — Other Ambulatory Visit: Payer: Self-pay | Admitting: Cardiology

## 2023-10-15 NOTE — Telephone Encounter (Signed)
 Pt called and LVM. Called back - N/A LVM

## 2023-10-16 NOTE — Telephone Encounter (Signed)
 Spoke to patient, would like to stay on the 7.5 mg Zepbound  dose. Able to achieve and maintain the goal weight on this dose. Prescription for 1 month with 3 refills sent to the pharmacy

## 2023-10-23 ENCOUNTER — Other Ambulatory Visit (HOSPITAL_COMMUNITY): Payer: Self-pay | Admitting: Cardiology

## 2023-11-11 ENCOUNTER — Ambulatory Visit: Attending: Cardiology | Admitting: *Deleted

## 2023-11-11 DIAGNOSIS — Z7901 Long term (current) use of anticoagulants: Secondary | ICD-10-CM | POA: Diagnosis not present

## 2023-11-11 DIAGNOSIS — I359 Nonrheumatic aortic valve disorder, unspecified: Secondary | ICD-10-CM | POA: Diagnosis not present

## 2023-11-11 LAB — POCT INR: INR: 2.8 (ref 2.0–3.0)

## 2023-11-11 NOTE — Progress Notes (Signed)
 Description   DO NOT PRINT INR-2.8; Today take 1 tablet of warfarin then continue taking Warfarin 1 tablet daily except for 2 tablets on Tuesday and Saturday. Recheck INR in 6 weeks. Coumadin  Clinic  [phone]

## 2023-11-11 NOTE — Patient Instructions (Signed)
 Description   DO NOT PRINT INR-2.8; Today take 1 tablet of warfarin then continue taking Warfarin 1 tablet daily except for 2 tablets on Tuesday and Saturday. Recheck INR in 6 weeks. Coumadin  Clinic  [phone]

## 2023-12-08 ENCOUNTER — Other Ambulatory Visit (HOSPITAL_COMMUNITY): Payer: Self-pay | Admitting: *Deleted

## 2023-12-08 ENCOUNTER — Other Ambulatory Visit (HOSPITAL_COMMUNITY): Payer: Self-pay

## 2023-12-08 DIAGNOSIS — I359 Nonrheumatic aortic valve disorder, unspecified: Secondary | ICD-10-CM

## 2023-12-08 DIAGNOSIS — R55 Syncope and collapse: Secondary | ICD-10-CM

## 2023-12-09 ENCOUNTER — Encounter (HOSPITAL_COMMUNITY): Payer: Self-pay | Admitting: Cardiology

## 2023-12-09 ENCOUNTER — Ambulatory Visit (HOSPITAL_COMMUNITY)
Admission: RE | Admit: 2023-12-09 | Discharge: 2023-12-09 | Disposition: A | Discharge status: Home / Self Care | Source: Ambulatory Visit | Attending: Cardiology | Admitting: Cardiology

## 2023-12-09 ENCOUNTER — Ambulatory Visit (HOSPITAL_COMMUNITY): Payer: Self-pay | Admitting: Cardiology

## 2023-12-09 ENCOUNTER — Ambulatory Visit (HOSPITAL_COMMUNITY): Admission: RE | Admit: 2023-12-09 | Discharge: 2023-12-09 | Attending: Cardiology | Admitting: Cardiology

## 2023-12-09 VITALS — BP 113/76 | HR 63 | Ht 75.0 in | Wt 210.4 lb

## 2023-12-09 DIAGNOSIS — Z79899 Other long term (current) drug therapy: Secondary | ICD-10-CM | POA: Diagnosis not present

## 2023-12-09 DIAGNOSIS — I38 Endocarditis, valve unspecified: Secondary | ICD-10-CM | POA: Insufficient documentation

## 2023-12-09 DIAGNOSIS — I359 Nonrheumatic aortic valve disorder, unspecified: Secondary | ICD-10-CM | POA: Diagnosis not present

## 2023-12-09 DIAGNOSIS — I77810 Thoracic aortic ectasia: Secondary | ICD-10-CM | POA: Diagnosis not present

## 2023-12-09 DIAGNOSIS — I351 Nonrheumatic aortic (valve) insufficiency: Secondary | ICD-10-CM | POA: Diagnosis not present

## 2023-12-09 DIAGNOSIS — R55 Syncope and collapse: Secondary | ICD-10-CM | POA: Insufficient documentation

## 2023-12-09 DIAGNOSIS — Z952 Presence of prosthetic heart valve: Secondary | ICD-10-CM | POA: Diagnosis not present

## 2023-12-09 DIAGNOSIS — I517 Cardiomegaly: Secondary | ICD-10-CM | POA: Insufficient documentation

## 2023-12-09 DIAGNOSIS — Q2381 Bicuspid aortic valve: Secondary | ICD-10-CM | POA: Diagnosis not present

## 2023-12-09 DIAGNOSIS — I5032 Chronic diastolic (congestive) heart failure: Secondary | ICD-10-CM | POA: Diagnosis not present

## 2023-12-09 DIAGNOSIS — Z7982 Long term (current) use of aspirin: Secondary | ICD-10-CM | POA: Diagnosis not present

## 2023-12-09 DIAGNOSIS — Z953 Presence of xenogenic heart valve: Secondary | ICD-10-CM | POA: Diagnosis not present

## 2023-12-09 DIAGNOSIS — E785 Hyperlipidemia, unspecified: Secondary | ICD-10-CM | POA: Insufficient documentation

## 2023-12-09 LAB — ECHOCARDIOGRAM COMPLETE
AR max vel: 3.67 cm2
AV Area VTI: 3.62 cm2
AV Area mean vel: 3.66 cm2
AV Mean grad: 6 mmHg
AV Peak grad: 12.3 mmHg
Ao pk vel: 1.75 m/s
Area-P 1/2: 2.99 cm2
Calc EF: 53.4 %
S' Lateral: 3.7 cm
Single Plane A2C EF: 56.8 %
Single Plane A4C EF: 52.3 %

## 2023-12-09 MED ORDER — ROSUVASTATIN CALCIUM 20 MG PO TABS: 20.0000 mg | ORAL_TABLET | Freq: Every day | ORAL | 3 refills | Status: AC

## 2023-12-09 NOTE — Progress Notes (Signed)
  Echocardiogram 2D Echocardiogram has been performed.  Devora Ellouise SAUNDERS 12/09/2023, 3:01 PM

## 2023-12-09 NOTE — Patient Instructions (Signed)
 Great to see you today!!!  DECREASE Crestor  to 20 mg Daily  Your physician recommends that you return for lab work in: 1 year (December 2026), **PLEASE CALL OUR OFFICE IN OCTOBER TO SCHEDULE THIS APPOINTMENT  If you have any questions or concerns before your next appointment please send us  a message through [place] or call our office at [phone].    TO LEAVE A MESSAGE FOR THE NURSE SELECT OPTION 2, PLEASE LEAVE A MESSAGE INCLUDING: YOUR NAME DATE OF BIRTH CALL BACK NUMBER REASON FOR CALL**this is important as we prioritize the call backs  YOU WILL RECEIVE A CALL BACK THE SAME DAY AS LONG AS YOU CALL BEFORE 4:00 PM  At the [facility], you and your health needs are our priority. As part of our continuing mission to provide you with exceptional heart care, we have created designated Provider Care Teams. These Care Teams include your primary Cardiologist (physician) and Advanced Practice Providers (APPs- Physician Assistants and Nurse Practitioners) who all work together to provide you with the care you need, when you need it.   You may see any of the following providers on your designated Care Team at your next follow up: Dr Toribio Fuel Dr Ezra Shuck Dr. Morene Brownie Greig Mosses, NP Caffie Shed, [place] [facility] [place], [place] Beckey Coe, NP Jordan Lee, NP Ellouise Class, NP Tinnie Redman, PharmD Jaun Bash, PharmD   Please be sure to bring in all your medications bottles to every appointment.    Thank you for choosing [facility]

## 2023-12-10 ENCOUNTER — Other Ambulatory Visit (HOSPITAL_COMMUNITY): Payer: Self-pay | Admitting: Cardiology

## 2023-12-10 DIAGNOSIS — I359 Nonrheumatic aortic valve disorder, unspecified: Secondary | ICD-10-CM

## 2023-12-10 NOTE — Progress Notes (Signed)
 Patient ID: Corey Rose, male   DOB: 09/11/1975, 48 y.o.   MRN: [id number] PCP: Dr. Shayne Cardiology: Dr. Rolan  Chief complaint: Aortic valve disorder  48 y.o.with history of bicuspid valve disorder s/p AVR x 3 presents for followup of aortic valve disorder.  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  He had a homograft aortic valve placed at that time.  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  He had repeat AVR with a Freestyle Medtronic bioprosthetic valve in 2009. Both initial AVRs were at [facility] (Dr. Theresa).    In 2019, he was noted to have a louder diastolic murmur and significant aortic insufficiency was noted.  He was seen again at [facility], and decision was made to undergo TAVR.  Initial TAVR valve was undersized and migrated.  A wall stent was used to crush this valve against the wall of the aortic root, leading to severe AI.  The patient developed flash pulmonary edema and was put on ECMO for several days. After wall stent was placed, a Medtronic Corevalve was deployed in the aortic position.  The patient was taken off ECMO.  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 He underwent extensive rehab at the [facility] in [place] and seems to have recovered nearly fully from strokes.   Repeat TEE in 8/19 showed severe peri-valvular aortic insufficiency around the Medtronic Corevalve prosthesis with preserved EF.   Patient was then seen at the [facility].  He had a 3rd sternotomy with removal of the 2 TAVR valves and wall stent, Bentall procedure with On-X mechanical aortic valve/28 mm Hemashield graft and reimplantation of the coronaries.    Echo in 9/19 showed EF 50-55% (low normal to mildly decreased), mildly dilated RV with mildly decreased systolic function, normal mechanical aortic valve.  Repeat echo in 6/20 showed stable EF 50-55%, mild LV dilation, mild RV dilation  with normal systolic function, normal mechanical aortic valve.   Echo in 3/22 showed EF 50% with mild LV dilation, normal RV, On-X mechanical aortic valve functions normally.   Patient had a syncopal episode 12/08/22.  He stood up and transiently passed out, falling onto the edge of a desk.  He reports that he had been having worsening lightheadedness with standing for several months prior, coinciding with significant weight loss on tirzepatide .  Entresto  was stopped.  Echo in 12/24 showed EF 50%, mild LV dilation, mild RV enlargement with normal RV systolic function, On-X mechanical aortic valve with mean gradient 5 mmHg and no AI.   Echo was done today and I reviewed, EF 50-55%, normal strain pattern, mildly dilated RV with normal systolic function, mechanical aortic valve with mean gradient 6 mmHg, ascending aorta 4.2 cm, IVC normal.   He returns today for followup of mechanical AVR. No further lightheadedness/syncope since stopping Entresto  and then valsartan .  Lifts weights and jogs, no significant exertional dyspnea.  No chest pain.  He feels like his legs are weak/give out easily.  Occasional palpitations but not really bothersome.   ECG (personally reviewed): NSR, nonspecific T wave flattening  Labs (2/24): LDL 58  PMH: 1. Hyperlipidemia: Myalgias with simvastatin. 2. 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 AVRs were both at  [facility], Dr. Theresa.  Echo (1/14) with EF 55-60%, bioprosthetic aortic valve with mean gradient 5 mmHg.  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  Echo (3/16) with EF 55-60%, bioprosthetic aortic valve with mean gradient 4 mmHg and trivial AI, normal RV size and systolic function.  - Echo (4/18): EF 60-65%, well-seated bioprosthetic aortic valve with no  stenosis, mild regurgitation.  - MRA chest (1/17) with 4.3 cm ascending aorta.  - Development of severe AI in 2019 => he had a complicated procedure in 6/19 with failed initial TAVR with migration of valve followed by wall stent followed Medtronic Corevalve.   - TAVR valve noted to have severe peri-valvular regurgitation on 8/19 TEE.  - In 9/19, He had a 3rd sternotomy with removal of the 2 TAVR valves and wall stent, Bentall procedure with On-X mechanical aortic valve/28 mm Hemashield graft and reimplantation of the coronaries.  - Echo (9/19) showed EF 50-55% (low normal to mildly decreased), mildly dilated RV with mildly decreased systolic function, normal mechanical aortic valve.  - Echo (6/20) showed stable EF 50-55%, mild LV dilation, mild RV dilation with normal systolic function, normal mechanical aortic valve (On-X).   - Echo (3/22): EF 50% with mild LV dilation, normal RV, On-X mechanical aortic valve functions normally.  - CTA chest (6/22): Stable post-op Bentall - Echo (12/24): EF 50%, mild LV dilation, mild RV enlargement with normal RV systolic function, On-X mechanical aortic valve with mean gradient 5 mmHg and no AI. - Echo (12/25): EF 50-55%, normal strain pattern, mildly dilated RV with normal systolic function, mechanical aortic valve with mean gradient 6 mmHg, ascending aorta 4.2 cm, IVC normal.  3. H/o prostatitis 4. H/o bladder stones 5. CVAs: Peri-operatively with failed TAVR in 6/19.  6. PVCs 7. Syncope (12/24) thought to be due to orthostatic hypotension.  - Zio monitor in 12/24 showed no significant arrhythmias.   SH: Married, 2 children, lives in [place].  Owns [employer]   FH: Father with atrial fibrillation, grandfather with heart disease.   ROS: All systems reviewed and negative except as per HPI.   Current Outpatient Medications  Medication Sig Dispense Refill   Apoaequorin (PREVAGEN PO) Take by mouth.     aspirin  81 MG tablet Take 1 tablet (81 mg total) by  mouth daily. 30 tablet 3   Magnesium  Hydroxide (MAGNESIA PO) Take 400 mg by mouth daily at 6 (six) AM.     Melatonin 3 MG TABS Take 6 mg by mouth at bedtime.     metoprolol  succinate (TOPROL -XL) 25 MG 24 hr tablet TAKE ONE TABLET BY MOUTH AT BEDTIME 90 tablet 3   mirtazapine  (REMERON ) 7.5 MG tablet Take 7.5 mg by mouth at bedtime.     Omega-3 Fatty Acids (FISH OIL) 1000 MG CAPS Take 1 capsule by mouth daily.     sildenafil  (VIAGRA ) 50 MG tablet TAKE 1 TABLET(50 MG) BY MOUTH DAILY AS NEEDED FOR ERECTILE DYSFUNCTION 10 tablet 0   Tamsulosin  HCl (FLOMAX ) 0.4 MG CAPS Take 0.4 mg by mouth daily.     tirzepatide  (ZEPBOUND ) 7.5 MG/0.5ML Pen Inject 7.5 mg into the skin once a week. 2 mL 3   vortioxetine HBr (TRINTELLIX) 10 MG TABS tablet Take 10 mg by mouth daily.     warfarin (COUMADIN ) 5 MG tablet TAKE 1 TO 2 TABLETS DAILY AS DIRECTED BY COUMADIN  CLINIC 50 tablet 2   rosuvastatin  (CRESTOR ) 20 MG tablet Take 1 tablet (20 mg total) by mouth daily. 90 tablet 3  No current facility-administered medications for this encounter.   BP 113/76   Pulse 63   Ht 6' 3 (1.905 m)   Wt 95.4 kg (210 lb 6.4 oz)   SpO2 100%   BMI 26.30 kg/m  General: NAD Neck: No JVD, no thyromegaly or thyroid nodule.  Lungs: Clear to auscultation bilaterally with normal respiratory effort. CV: Nondisplaced PMI.  Heart regular S1/S2 with mechanical S2, no S3/S4, 1/6 SEM RUSB.  No peripheral edema.  No carotid bruit.  Normal pedal pulses.  Abdomen: Soft, nontender, no hepatosplenomegaly, no distention.  Skin: Intact without lesions or rashes.  Neurologic: Alert and oriented x 3.  Psych: Normal affect. Extremities: No clubbing or cyanosis.  HEENT: Normal.   Assessment/Plan: 1. Bicuspid aortic valve disorder: Patient had bicuspid aortic valve and developed endocarditis.  He had AVR initially in 1997 then developed a pseudoaneurysm of the aortic root and repeat AVR with a bioprosthetic valve in 2009.  2019 developed severe AI,  had failed TAVR procedure in 6/19 followed by Bentall procedure with On-X mechanical aortic valve/28 mm Hemashield graft and coronary reimplantation in 9/19.   CTA chest showed stable ascending aorta in 6/22.  Echo today showed stable mechanical aortic valve.  - Needs antibiotic prophylaxis with dental procedures.  - Patient has an On-X mechanical valve. Based on data from PROACT trial, he could decrease INR goal to 1.5-2 long-term.  He has had no bleeding issues, will keep his goal 2-2.5 for now.   - Continue ASA 81 daily.   - CBC done recently by PCP, will try to get copy.  2. Hyperlipidemia: He is on Crestor  40 mg daily. He has had some leg weakness recently, ?related to statin versus muscle loss on tirzepatide .   - Decrease Crestor  to 20 mg daily to see if this helps muscle symptoms.  Will get copy of recent lipid panel from PCP.   3. CVAs: Peri-operatively with failed TAVR.  He has recovered back to his baseline.  4. PVCs: No palpitations recently. Zio monitor in 12/24 with no significant abnormalities.  5. Chronic HF with recovered EF: Echo today showed LV EF 50-55%.   - Continue Toprol  XL 25 mg daily.   - Orthostatic with Entresto  and valsartan , will stay off.   6. Syncope: in 12/24.  Suspect due to orthostatic hypotension with Entresto  use, Viagra  use, and significant weight loss on tirzepatide .  Orthostatic symptoms have resolved off Entresto  and then valsartan . Zio monitor with no significant abnormalities in 12/24.  7. Leg weakness: As above, ?related to statin vs muscle loss from tirzepatide .  - Would do resistance exercise with legs to build muscle.  - Will see if cutting statin in half will help.   Followup in 1 year  I spent 31 minutes reviewing records, interviewing/examining patient, and managing order   Ezra Shuck 12/10/2023

## 2023-12-11 NOTE — Telephone Encounter (Signed)
 Prescription refill request received for warfarin [facility]:  [facility] 12/09/2023 Next INR check: 12/23/2023 Warfarin tablet strength: 5mg 

## 2023-12-23 ENCOUNTER — Ambulatory Visit: Attending: Cardiology | Admitting: *Deleted

## 2023-12-23 DIAGNOSIS — Z7901 Long term (current) use of anticoagulants: Secondary | ICD-10-CM

## 2023-12-23 DIAGNOSIS — I359 Nonrheumatic aortic valve disorder, unspecified: Secondary | ICD-10-CM

## 2023-12-23 LAB — POCT INR: INR: 3 (ref 2.0–3.0)

## 2023-12-23 NOTE — Patient Instructions (Addendum)
 Description   DO NOT PRINT INR-3.0; Today take 1 tablet of warfarin then START taking Warfarin 1 tablet daily except for 2 tablets on Tuesday. Recheck INR in 4 weeks. Coumadin  Clinic  [phone]

## 2023-12-23 NOTE — Progress Notes (Signed)
 Description   DO NOT PRINT INR-3.0; Today take 1 tablet of warfarin then START taking Warfarin 1 tablet daily except for 2 tablets on Tuesday. Recheck INR in 4 weeks. Coumadin  Clinic  [phone]

## 2023-12-30 NOTE — Telephone Encounter (Signed)
 This encounter was created in error - please disregard.

## 2024-01-13 ENCOUNTER — Other Ambulatory Visit (HOSPITAL_COMMUNITY): Payer: Self-pay

## 2024-01-18 ENCOUNTER — Other Ambulatory Visit (HOSPITAL_COMMUNITY): Payer: Self-pay | Admitting: Cardiology

## 2024-01-20 ENCOUNTER — Ambulatory Visit: Admitting: *Deleted

## 2024-01-20 DIAGNOSIS — I359 Nonrheumatic aortic valve disorder, unspecified: Secondary | ICD-10-CM | POA: Diagnosis not present

## 2024-01-20 DIAGNOSIS — Z7901 Long term (current) use of anticoagulants: Secondary | ICD-10-CM | POA: Diagnosis not present

## 2024-01-20 LAB — POCT INR: INR: 2.6 (ref 2.0–3.0)

## 2024-01-20 NOTE — Progress Notes (Signed)
 INR goal 2-2.5  Description   DO NOT PRINT INR-2.6;Have a leafy veggie today then continue taking Warfarin 1 tablet daily except for 2 tablets on Tuesday. Recheck INR in 4 weeks. Coumadin  Clinic  [phone]

## 2024-01-20 NOTE — Patient Instructions (Signed)
 Description   DO NOT PRINT INR-2.6;Have a leafy veggie today then continue taking Warfarin 1 tablet daily except for 2 tablets on Tuesday. Recheck INR in 4 weeks. Coumadin  Clinic  [phone]

## 2024-02-03 ENCOUNTER — Other Ambulatory Visit: Payer: Self-pay | Admitting: Cardiology

## 2024-02-04 NOTE — Telephone Encounter (Signed)
 Wants to continue at 7.5 mg Zepbound  dose. Lost 30 lbs so far. At goal weight.

## 2024-02-09 ENCOUNTER — Other Ambulatory Visit (HOSPITAL_COMMUNITY): Payer: Self-pay | Admitting: Cardiology

## 2024-02-17 ENCOUNTER — Ambulatory Visit
